# Patient Record
Sex: Female | Born: 1935 | Race: White | Hispanic: No | Smoking: Former smoker
Health system: Southern US, Community
[De-identification: ages and names within clinical notes are randomized; demographics above are authoritative.]

## PROBLEM LIST (undated history)

## (undated) DIAGNOSIS — J310 Chronic rhinitis: Secondary | ICD-10-CM

## (undated) DIAGNOSIS — I10 Essential (primary) hypertension: Secondary | ICD-10-CM

## (undated) DIAGNOSIS — E785 Hyperlipidemia, unspecified: Secondary | ICD-10-CM

## (undated) DIAGNOSIS — M47812 Spondylosis without myelopathy or radiculopathy, cervical region: Secondary | ICD-10-CM

## (undated) DIAGNOSIS — J8410 Pulmonary fibrosis, unspecified: Secondary | ICD-10-CM

## (undated) DIAGNOSIS — M8580 Other specified disorders of bone density and structure, unspecified site: Secondary | ICD-10-CM

## (undated) DIAGNOSIS — I471 Supraventricular tachycardia, unspecified: Secondary | ICD-10-CM

## (undated) DIAGNOSIS — M5030 Other cervical disc degeneration, unspecified cervical region: Secondary | ICD-10-CM

## (undated) DIAGNOSIS — J984 Other disorders of lung: Secondary | ICD-10-CM

## (undated) DIAGNOSIS — K219 Gastro-esophageal reflux disease without esophagitis: Secondary | ICD-10-CM

## (undated) DIAGNOSIS — J189 Pneumonia, unspecified organism: Secondary | ICD-10-CM

## (undated) DIAGNOSIS — K8020 Calculus of gallbladder without cholecystitis without obstruction: Secondary | ICD-10-CM

## (undated) HISTORY — DX: Chronic rhinitis: J31.0

## (undated) HISTORY — DX: Supraventricular tachycardia: I47.1

## (undated) HISTORY — DX: Other disorders of lung: J98.4

## (undated) HISTORY — DX: Spondylosis without myelopathy or radiculopathy, cervical region: M47.812

## (undated) HISTORY — DX: Essential (primary) hypertension: I10

## (undated) HISTORY — PX: TONSILLECTOMY: SHX5217

## (undated) HISTORY — DX: Pulmonary fibrosis, unspecified: J84.10

## (undated) HISTORY — PX: KNEE SURGERY: SHX244

## (undated) HISTORY — PX: ANKLE SURGERY: SHX546

## (undated) HISTORY — PX: TONSILLECTOMY AND ADENOIDECTOMY: SUR1326

## (undated) HISTORY — DX: Other specified disorders of bone density and structure, unspecified site: M85.80

## (undated) HISTORY — DX: Hyperlipidemia, unspecified: E78.5

## (undated) HISTORY — PX: DILATION AND CURETTAGE OF UTERUS: SHX78

## (undated) HISTORY — PX: BREAST SURGERY: SHX581

## (undated) HISTORY — DX: Other cervical disc degeneration, unspecified cervical region: M50.30

## (undated) HISTORY — DX: Supraventricular tachycardia, unspecified: I47.10

## (undated) HISTORY — DX: Gastro-esophageal reflux disease without esophagitis: K21.9

## (undated) HISTORY — DX: Pneumonia, unspecified organism: J18.9

---

## 2006-01-04 HISTORY — PX: OTHER SURGICAL HISTORY: SHX169

## 2006-06-27 ENCOUNTER — Ambulatory Visit: Payer: Self-pay | Admitting: Cardiology

## 2006-07-05 ENCOUNTER — Ambulatory Visit: Payer: Self-pay | Admitting: Cardiology

## 2006-08-03 ENCOUNTER — Ambulatory Visit: Payer: Self-pay | Admitting: Cardiology

## 2006-11-23 ENCOUNTER — Ambulatory Visit: Payer: Self-pay | Admitting: Cardiology

## 2006-11-25 ENCOUNTER — Ambulatory Visit: Payer: Self-pay | Admitting: Internal Medicine

## 2006-12-05 ENCOUNTER — Ambulatory Visit: Payer: Self-pay | Admitting: Internal Medicine

## 2006-12-05 ENCOUNTER — Observation Stay (HOSPITAL_COMMUNITY): Admission: AD | Admit: 2006-12-05 | Discharge: 2006-12-06 | Payer: Self-pay | Admitting: Internal Medicine

## 2006-12-05 ENCOUNTER — Ambulatory Visit: Payer: Self-pay | Admitting: Cardiology

## 2007-01-13 ENCOUNTER — Ambulatory Visit: Payer: Self-pay | Admitting: Internal Medicine

## 2010-05-19 NOTE — Letter (Signed)
January 13, 2007    Doreen Beam, MD  [address]  [place], [place] [address]   RE:  BINDI, KLOMP  MRN:  [id number]  /  DOB:  19-Sep-1933   Dear Herminio Commons:   Happy New Year.  Mrs. Mutch is seen following AV nodal modification for  supraventricular tachycardia.  She had some tachy palpitations for a  couple of weeks afterwards which have now abated and she is doing quite  well.   Her medications include multiple nutraceuticals as well as Metoprolol  taken once daily.  Her Cardizem was discontinued at the time of her  procedure.   PHYSICAL EXAMINATION:  VITAL SIGNS:  Today, her blood pressure is  133/83.  Her pulse is 75.  LUNGS:  Clear.  HEART:  Sounds were regular.  EXTREMITIES:  Without edema. The groins were not evaluated.  SKIN:  Warm and dry.   An electrocardiogram, dated today, demonstrated sinus rhythm at 68 with  intervals of 0.19, 0.8, 0.39.  The axis was 57 degrees.   IMPRESSION:  1. AV nodal re-entry tachycardia, status post slow pathway      modification.  2. Borderline blood pressure.   Dhruv, Mrs. Sochacki is doing well from an arrhythmia point-of-view.  I have  asked that in anticipation of her visit to you in March, that she wean  herself off of her Toprol.  The recent data suggesting there are greater  benefits for blood pressure management using ACEs and ARBs as  opposed to beta-blockers would suggest that if we do not need to control  her rhythm anymore that an alternative antihypertensive would be  appropriate.   We will be glad to see her again as needed at your request.  Thank you  again for allowing [place] to participate in her care.    Sincerely,      Duke Salvia, MD, [facility]  Electronically Signed    SCK/MedQ  DD: 01/13/2007  DT: 01/13/2007  Job #: [id number]

## 2010-05-19 NOTE — Assessment & Plan Note (Signed)
[facility]                                 ON-CALL NOTE   NAME:CHEWRussia, [name]                           MRN:          [id number]  DATE:11/23/2006                            DOB:          02-28-1933    PRIMARY CARDIOLOGIST:  Dr. Andee Lineman.   I received page to the answering service from [phone] from Ms.  Rafanan.  She states she has a history of supraventricular tachycardia, and  has a prescription for Cardizem 30 mg p.o. p.r.n. for sudden onset of  palpitations.  She states she is also on atenolol which she takes  regularly.  Tonight she had an episode of rapid heartbeat without  complaints of chest discomfort, lightheadedness, or dizziness.  She was  just very aware of the palpitations.  She had taken Cardizem 30 mg p.o.  dose, and was still experiencing the palpitations.  Once again, she  denied any other associated symptoms.  I instructed to her to repeat the  Cardizem as it had been an hour since she had taken the first dose, and  to wait 20 to 30 minutes if she did not feel any relief of her  palpitations/ tachycardia she was to go ahead and seek medical  assistance at the closest emergency room.  She states she lives in  [place], [place].  She would take the Cardizem, and if she was not  feeling any better, she would go to the closest emergency room there.      Dorian Pod, ACNP  Electronically Signed      Jonelle Sidle, MD  Electronically Signed   MB/MedQ  DD: 11/23/2006  DT: 11/24/2006  Job #: [id number]

## 2010-05-19 NOTE — Assessment & Plan Note (Signed)
[facility]                          [facility] OFFICE NOTE   NAME:[name], [name]                         MRN:          [id number]  DATE:08/03/2006                            DOB:          1933-12-20    HISTORY OF PRESENT ILLNESS:  The patient is a 77 year old female  recently admitted to [facility] with supraventricular tachycardia  with short PR interval suggestive of AV nodal reentry tachycardia.  The  patient has a history of nonobstructive coronary artery disease by  catheterization in [facility] in 2003.  An echocardiographic study done  during her recent hospitalization demonstrates normal LV size and  function with an ejection fraction of 60%.  The patient was discharged  on atenolol 50 mg b.i.d. and p.r.n. diltiazem.  She has had no  recurrence of palpitations since hospital discharge; in fact, she is  doing quite well.   MEDICATIONS:  1. Atenolol 50 mg p.o. b.i.d.  2. Tenex 2 mg p.o. daily.  3. Aspirin 81 mg daily.  4. Lipitor 40 mg p.o. daily.  5. Glucosamine, calcium and Coenzyme Q-10 and fish oil.   PHYSICAL EXAMINATION:  VITAL SIGNS:  Blood pressure 140/83, heart rate  61 beats per minute, weight 140 pounds.  NECK:  Normal carotid upstroke.  No carotid bruits.  LUNGS:  Clear breath sounds bilaterally.  HEART:  Regular rate and rhythm with normal S1, S2, no murmurs, rubs or  gallops.  ABDOMEN:  Soft.  EXTREMITIES:  No cyanosis, clubbing or edema.  NEUROLOGIC:  The patient alert and oriented and grossly nonfocal.   PROBLEM LIST:  1. 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  2. No evidence of structural heart disease.  3. Hypertension.  4. Nonobstructive coronary artery disease by catheterization 2003.   PLAN:  1. The patient is doing well from a cardiovascular perspective.  She      has had no recurrent palpitations.  2. We had a brief discussion again about radiofrequency ablation but      given the fact that  these      episodes occur very infrequently, I do not think the patient needs      to proceed with this procedure at the present time.  We did give      her literature and more information.     Learta Codding, MD,FACC  Electronically Signed    GED/MedQ  DD: 08/03/2006  DT: 08/04/2006  Job #: [id number]   cc:   Doreen Beam

## 2010-05-19 NOTE — Op Note (Signed)
NAMEHULA, TASSO NO.:  [id number]   MEDICAL RECORD NO.:  [id number]          PATIENT TYPE:  INP   LOCATION:  3707                         FACILITY:  [facility]   PHYSICIAN:  Duke Salvia, MD, FACCDATE OF BIRTH:  03/07/1933   DATE OF PROCEDURE:  12/05/2006  DATE OF DISCHARGE:                               OPERATIVE REPORT   PREOPERATIVE DIAGNOSIS:  Supraventricular tachycardia.   POSTOPERATIVE DIAGNOSIS:  Atrioventricular nodal reentry tachycardia.   PROCEDURE:  Invasive electrophysiological study of the left heart and  electrophysiological study radio frequency catheter ablation.   Following the obtaining of informed consent, the patient was brought to  the electrophysiology laboratory and placed on the fluoroscopic table in  the supine position.  After routine prep and drape, cardiac  catheterization was performed with local anesthesia and conscious  sedation.  Noninvasive blood pressure monitoring, transcutaneous oxygen  saturation monitoring and end tidal CO2 monitoring were performed  continuously throughout the procedure.  Following the procedure, the  catheter was removed, hemostasis was obtained and this patient was  transferred to the floor in stable condition.   Quadripolar catheter was inserted via the left femoral vein in the AV  junction.  Next, a 5-French quadripolar catheter was inserted via left  femoral vein to the right ventricular apex.  A 6-French octapolar  catheter inserted via the right femoral vein to the coronary sinus.  A 7  French 4 mm deflectable tip ablation catheter was inserted via the right  femoral vein using an SL2 sheath and mapping sites in the posterior  septal space.   The leads I, AVS and VI were monitored continuously throughout the  procedure.  Following insertion of the catheters, the stimulation  protocol included incremental atrial pacing, incremental ventricular  pacing, single and double atrial extra stimuli  with pace cyclings at 500  and 400 milliseconds.   RESULTS:  1. First Electrocardiogram At Basic Intervals:  Initial rhythm sinus.      RR interval, 675 milliseconds.  QRS duration is 79 milliseconds.      QT interval 346 milliseconds.  P wave duration 98 milliseconds.      Bundle branch block absent.  Preexcitation absent.  AH interval 117      milliseconds.  HE interval 41 milliseconds.  2. Final:  Rhythm:  Sinus.  RR interval 553 milliseconds.  PR interval      168 milliseconds.  QRS duration 105 milliseconds.  QT interval 379      milliseconds.  P wave duration 96 milliseconds.  Bundle branch      block absent.  Preexcitation absent.  AH interval 103 milliseconds.      HE interval 52 milliseconds.   AV Wenckebach preablation was shorter than 320 milliseconds.  Postablation was 360 milliseconds.  VA Wenckebach was at about 400  milliseconds.  The AV nodal effect was refractory.  At a pace cycling at  400 milliseconds was 230 milliseconds, then 500 milliseconds and 300  milliseconds.   AV conduction preablation was discontinued for the echo beats and post  ablation, there was an  isolated echo beat prior to ERP.   Accessory pathway function, no evidence of any accessory pathway was  identified.   Ventricular response to programmed stimulation was normal for V  stimulation as described previously.   Next, sensory arrhythmia is induced.  AV nodal reentry (low-fast  tachycardia was reproducibly induced), this was done both at burst  pacing from the coronary sinuses as well as single atrial extrastimuli  at 400:270-250.  The tachycardia cycling ranged from 330 milliseconds to  approximately 280 milliseconds and often had a left bundle branch block  configuration.  It was terminated with coronary sinus pacing at 240  milliseconds.   In a typical episode of tachycardia AH interval was 215 milliseconds.  The AE interval was 73 milliseconds and the VA time was 22 milliseconds.    Fluoroscopy time a total of 3 minutes 26 seconds and fluoroscopy time  was utilized at 7-1/2 frames per second.   Radiofrequency energy:  A total of 36 seconds of RF energy was applied  in three applications with the result of junctional rhythm and the  noninducibility of the patient's clinical arrhythmia.   IMPRESSION:  1. Normal sinus function.  2. Normal atrial function.  3. Dual antegrade atrioventricular nodal physiology with a dual  slow-      fast atrioventricular nodal reentry tachycardia, the substrate of      which was successfully ablated and rendering the patient      noninducible.  4. Normal His Purkinje system function.  5. No accessory pathway.  6. Normal ventricular response to programmed stimulation.   SUMMARY:  In conclusion, the results of electrophysiological testing  demonstrated AV nodal reentry tachycardia as the patient's underlying  arrhythmia mechanism.  The patient's arrhythmic substrate was  successfully modified using radiofrequency energy.   The patient tolerated the procedure without apparent complications.      Duke Salvia, MD, [facility]  Electronically Signed     SCK/MEDQ  D:  12/06/2006  T:  12/06/2006  Job:  [id number]

## 2010-07-05 HISTORY — PX: NASAL SINUS SURGERY: SHX719

## 2010-10-12 LAB — CBC
HCT: 39.8
Hemoglobin: 13.8
MCV: 92.8
RBC: 4.29
WBC: 8.7

## 2011-03-01 ENCOUNTER — Encounter: Payer: Self-pay | Admitting: Pulmonary Disease

## 2011-03-02 ENCOUNTER — Other Ambulatory Visit (INDEPENDENT_AMBULATORY_CARE_PROVIDER_SITE_OTHER): Payer: Medicare Other

## 2011-03-02 ENCOUNTER — Encounter: Payer: Self-pay | Admitting: Pulmonary Disease

## 2011-03-02 ENCOUNTER — Ambulatory Visit (INDEPENDENT_AMBULATORY_CARE_PROVIDER_SITE_OTHER): Payer: Medicare Other | Admitting: Pulmonary Disease

## 2011-03-02 VITALS — BP 130/78 | HR 79 | Temp 97.6°F | Ht 62.0 in | Wt 136.2 lb

## 2011-03-02 DIAGNOSIS — J849 Interstitial pulmonary disease, unspecified: Secondary | ICD-10-CM | POA: Insufficient documentation

## 2011-03-02 DIAGNOSIS — J8410 Pulmonary fibrosis, unspecified: Secondary | ICD-10-CM

## 2011-03-02 NOTE — Progress Notes (Signed)
  Subjective:    Patient ID: [name], female    DOB: September 27, 1933, 78 y.o.   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 She developed a sinus and ear infection in December and subsequent yellow sputum and was treated with antibiotics. Cough improved some and then returned, chest x-ray 12/24/2010 showed patchy parenchymal interstitial infiltrates with seem to have progressed compared to 07/01/2008. Just extra was repeated on 01/27/2011 and the chronic interstitial changes weren't again noted. CT chest on 02/04/2011 showed patchy areas of interstitial capacities with subpleural predominance and bibasal peripheral bronchiectasis. There were no groundglass opacities noted. Bilateral lymph nodes were calcified and splenic granulomas were noted . She was treated with Hydromet cough syrup and prednisone dosepak. She reports mild dyspnea on exertion, CT sinuses on 7/12 showed mild mucosal thickening. Her symptoms of reflux are well controlled on omeprazole. She denies joint pains or swelling, skin rash or photosensitivity. ANA pos, ACE level 18, ESR 31, RA < 10, hypersens panel pending She has a dog at home , and denies exposure to birds She did not desaturate on walking 3 laps around the office today.   Review of Systems  Constitutional: Positive for appetite change and unexpected weight change. Negative for fever.  HENT: Negative for ear pain, nosebleeds, congestion, sore throat, rhinorrhea, sneezing, trouble swallowing, dental problem, postnasal drip and sinus pressure.   Eyes: Negative for redness and itching.  Respiratory: Positive for cough and shortness of breath. Negative for chest tightness and wheezing.   Cardiovascular: Negative for palpitations and leg swelling.  Gastrointestinal: Negative for nausea and vomiting.  Genitourinary: Negative for dysuria.  Musculoskeletal:  Negative for joint swelling.  Skin: Negative for rash.  Neurological: Negative for headaches.  Hematological: Does not bruise/bleed easily.  Psychiatric/Behavioral: Negative for dysphoric mood. The patient is not nervous/anxious.        Objective:   Physical Exam  Gen. Pleasant, well-nourished, in no distress, normal affect ENT - no lesions, no post nasal drip Neck: No JVD, no thyromegaly, no carotid bruits Lungs: no use of accessory muscles, no dullness to percussion, bibasal velcro rales, no rhonchi  Cardiovascular: Rhythm regular, heart sounds  normal, no murmurs or gallops, no peripheral edema Abdomen: soft and non-tender, no hepatosplenomegaly, BS normal. Musculoskeletal: No deformities, no cyanosis or clubbing Neuro:  alert, non focal       Assessment & Plan:

## 2011-03-02 NOTE — Patient Instructions (Signed)
Lab work today PFT's to be done  Ambulatory walk done today Follow up in 2 weeks

## 2011-03-03 LAB — RHEUMATOID FACTOR: Rhuematoid fact SerPl-aCnc: <10 IU/mL (ref <=14)

## 2011-03-03 LAB — ANGIOTENSIN CONVERTING ENZYME: Angiotensin-Converting Enzyme: 18 U/L (ref 8–52)

## 2011-03-03 LAB — ANTI-NUCLEAR AB-TITER (ANA TITER)

## 2011-03-04 NOTE — Assessment & Plan Note (Signed)
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 Will obtain inflammatory markers and hypersensitivity panel . Doubt that this is related to collagenous vascular disease . Will obtain pulmonary function test- this radiologic appearances typical, I doubt that a biopsy would be necessary here. She may be a candidate for pulmonary rehabilitation based on her lung function. Unfortunately we have stopped enrolling patients for the Ascend trial (perfenidone for IPF) I discussed prognosis with her in some detail but will clarify on future visits based on lung function.

## 2011-03-07 LAB — HYPERSENSITIVITY PNUEMONITIS PROFILE

## 2011-03-09 ENCOUNTER — Ambulatory Visit (INDEPENDENT_AMBULATORY_CARE_PROVIDER_SITE_OTHER): Payer: Medicare Other | Admitting: Pulmonary Disease

## 2011-03-09 ENCOUNTER — Telehealth: Payer: Self-pay | Admitting: Pulmonary Disease

## 2011-03-09 DIAGNOSIS — J849 Interstitial pulmonary disease, unspecified: Secondary | ICD-10-CM

## 2011-03-09 DIAGNOSIS — J8410 Pulmonary fibrosis, unspecified: Secondary | ICD-10-CM

## 2011-03-09 LAB — PULMONARY FUNCTION TEST

## 2011-03-09 NOTE — Telephone Encounter (Signed)
Spoke with patient-aware that labs are ok.

## 2011-03-09 NOTE — Progress Notes (Signed)
PFT done today. 

## 2011-03-09 NOTE — Progress Notes (Signed)
Quick Note:  Pt aware of results. ______ 

## 2011-03-15 ENCOUNTER — Telehealth: Payer: Self-pay | Admitting: Pulmonary Disease

## 2011-03-15 NOTE — Telephone Encounter (Signed)
PFts show good lung volumes Diffusion decreased to 60%, can track this no in the future. No new recomm  at this time

## 2011-03-17 NOTE — Telephone Encounter (Signed)
Pt returned call. [name]  

## 2011-03-17 NOTE — Telephone Encounter (Signed)
I spoke with patient about results and she verbalized understanding and had no questions 

## 2011-03-17 NOTE — Telephone Encounter (Signed)
[id number] x1 

## 2011-03-22 ENCOUNTER — Encounter: Payer: Self-pay | Admitting: Pulmonary Disease

## 2011-03-29 ENCOUNTER — Encounter: Payer: Self-pay | Admitting: Pulmonary Disease

## 2011-03-29 ENCOUNTER — Ambulatory Visit (INDEPENDENT_AMBULATORY_CARE_PROVIDER_SITE_OTHER): Payer: Medicare Other | Admitting: Pulmonary Disease

## 2011-03-29 VITALS — BP 112/74 | HR 68 | Temp 97.8°F | Ht 62.0 in | Wt 133.4 lb

## 2011-03-29 DIAGNOSIS — J849 Interstitial pulmonary disease, unspecified: Secondary | ICD-10-CM

## 2011-03-29 DIAGNOSIS — J8410 Pulmonary fibrosis, unspecified: Secondary | ICD-10-CM

## 2011-03-29 MED ORDER — HYDROCODONE-HOMATROPINE 5-1.5 MG/5ML PO SYRP: 5.0000 mL | ORAL_SOLUTION | Freq: Three times a day (TID) | ORAL | Status: DC | PRN

## 2011-03-29 NOTE — Progress Notes (Signed)
  Subjective:    Patient ID: [name], female    DOB: 26-Jul-1933, 78 y.o.   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  She developed a sinus and ear infection in December and subsequent yellow sputum and was treated with antibiotics. Cough improved some and then returned, chest x-ray 12/24/2010 showed patchy parenchymal interstitial infiltrates with seem to have progressed compared to 07/01/2008. cxr was repeated on 01/27/2011 and the chronic interstitial changes weren't again noted. CT chest on 02/04/2011 showed patchy areas of interstitial capacities with subpleural predominance and bibasal peripheral bronchiectasis. There were no groundglass opacities noted. Bilateral lymph nodes were calcified and splenic granulomas were noted . She was treated with Hydromet cough syrup and prednisone dosepak.  She reports mild dyspnea on exertion, CT sinuses on 7/12 showed mild mucosal thickening. Her symptoms of reflux are well controlled on omeprazole.  She denies joint pains or swelling, skin rash or photosensitivity.  ANA pos, ACE level 18, ESR 31, RA < 10, hypersens panel pending  She has a dog at home , and denies exposure to birds  She did not desaturate on walking 3 laps around the office today.    03/29/2011 PFts- no airway obstruction, preserved lung volumes,  DLCO 59% corrects for alvaolar volume Reviewed imaging going back to 2005 ([facility]- for arthroscopy) show some bibasal & RUl scarring Pt states her breathing has been good still gets some winded but not as bad. Pt c/o dry hacking cough.--worse at night -relieved by hycodan  Review of Systems Patient denies significant dyspnea,cough, hemoptysis,  chest pain, palpitations, pedal edema, orthopnea, paroxysmal nocturnal dyspnea, lightheadedness, nausea, vomiting, abdominal or  leg pains      Objective:   Physical  Exam  Gen. Pleasant, well-nourished, in no distress ENT - no lesions, no post nasal drip Neck: No JVD, no thyromegaly, no carotid bruits Lungs: no use of accessory muscles, no dullness to percussion, bibasal rales, no  rhonchi  Cardiovascular: Rhythm regular, heart sounds  normal, no murmurs or gallops, no peripheral edema Musculoskeletal: No deformities, no cyanosis or clubbing         Assessment & Plan:

## 2011-03-29 NOTE — Assessment & Plan Note (Signed)
Mild restriction Will monitor q for worsening - this may be non progressive fibrosis Ct scan can be repeated in a year Await outcomes of ASCEND trial Doubt biopsy necessary here.

## 2011-03-29 NOTE — Patient Instructions (Signed)
Schedule Full PFTs in 6months with FU visit Hycodan refill - 2 tsp at bedtime as needed x 120 cc x 1 refill

## 2011-09-27 ENCOUNTER — Encounter: Payer: Self-pay | Admitting: Pulmonary Disease

## 2011-09-27 ENCOUNTER — Ambulatory Visit (INDEPENDENT_AMBULATORY_CARE_PROVIDER_SITE_OTHER): Payer: Medicare Other | Admitting: Pulmonary Disease

## 2011-09-27 VITALS — BP 130/76 | HR 66 | Temp 98.0°F | Ht 62.0 in | Wt 131.0 lb

## 2011-09-27 DIAGNOSIS — Z23 Encounter for immunization: Secondary | ICD-10-CM

## 2011-09-27 DIAGNOSIS — J8410 Pulmonary fibrosis, unspecified: Secondary | ICD-10-CM

## 2011-09-27 DIAGNOSIS — J849 Interstitial pulmonary disease, unspecified: Secondary | ICD-10-CM

## 2011-09-27 LAB — PULMONARY FUNCTION TEST

## 2011-09-27 MED ORDER — BENZONATATE 200 MG PO CAPS: 200.0000 mg | ORAL_CAPSULE | Freq: Three times a day (TID) | ORAL | Status: DC | PRN

## 2011-09-27 NOTE — Assessment & Plan Note (Signed)
Flu shot Your lung function is stable -DLCO maintained at 56% Take DELSYM cough syrup - 5 ml twice daily as needed for cough Take benzonatate cough perles 200 mg thrice daily as needed for cough Will monitor q 59m with CXR & for oxygen desaturation

## 2011-09-27 NOTE — Progress Notes (Signed)
PFT done today. 

## 2011-09-27 NOTE — Progress Notes (Signed)
  Subjective:    Patient ID: [name], female    DOB: 1933-05-23, 78 y.o.   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  She developed LRI, Cough improved some and then returned, chest x-ray 12/24/2010 showed patchy parenchymal interstitial infiltrates with seem to have progressed compared to 07/01/2008. cxr was repeated on 01/27/2011 and the chronic interstitial changes weren't again noted. CT chest on 02/04/2011 showed patchy areas of interstitial capacities with subpleural predominance and bibasal peripheral bronchiectasis. There were no groundglass opacities noted. Bilateral lymph nodes were calcified and splenic granulomas were noted . She was treated with Hydromet cough syrup and prednisone dosepak.  She reports mild dyspnea on exertion, CT sinuses on 7/12 showed mild mucosal thickening. Her symptoms of reflux are well controlled on omeprazole.  She denies joint pains or swelling, skin rash or photosensitivity.  ANA pos, ACE level 18, ESR 31, RA < 10, hypersens panel pending  She has a dog at home , and denies exposure to birds  She did not desaturate on walking 3 laps around the office today.   03/29/2011  PFts- no airway obstruction, preserved lung volumes,  DLCO 59% corrects for alvaolar volume  Reviewed imaging going back to 2005 ([facility]- for arthroscopy) show some bibasal & RUl scarring    09/27/2011 68m FU Pt states her breathing has been good still gets some winded Up a hill sometimes. Pt c/o dry hacking cough.--worse at night  Reflux ok PFTs stable - DLCO maintained at 56%  Review of Systems neg for any significant sore throat, dysphagia, itching, sneezing, nasal congestion or excess/ purulent secretions, fever, chills, sweats, unintended wt loss, pleuritic or exertional cp, hempoptysis, orthopnea pnd or change in chronic leg swelling. Also denies presyncope,  palpitations, heartburn, abdominal pain, nausea, vomiting, diarrhea or change in bowel or urinary habits, dysuria,hematuria, rash, arthralgias, visual complaints, headache, numbness weakness or ataxia.     Objective:   Physical Exam  Gen. Pleasant, well-nourished, in no distress ENT - no lesions, no post nasal drip Neck: No JVD, no thyromegaly, no carotid bruits Lungs: no use of accessory muscles, no dullness to percussion, rt basal rales, no rhonchi  Cardiovascular: Rhythm regular, heart sounds  normal, no murmurs or gallops, no peripheral edema Musculoskeletal: No deformities, no cyanosis or clubbing        Assessment & Plan:

## 2011-09-27 NOTE — Patient Instructions (Signed)
Flu shot Your lung function is stable Take DELSYM cough syrup - 5 ml twice daily as needed for cough Take benzonatate cough perles 200 mg thrice daily as needed for cough Chest xray next visit

## 2011-10-05 ENCOUNTER — Encounter: Payer: Self-pay | Admitting: Pulmonary Disease

## 2012-03-06 ENCOUNTER — Ambulatory Visit (INDEPENDENT_AMBULATORY_CARE_PROVIDER_SITE_OTHER): Payer: Medicare Other | Admitting: Surgery

## 2012-03-06 ENCOUNTER — Encounter (INDEPENDENT_AMBULATORY_CARE_PROVIDER_SITE_OTHER): Payer: Self-pay | Admitting: Surgery

## 2012-03-06 VITALS — BP 124/80 | HR 74 | Temp 97.9°F | Resp 14 | Ht 62.0 in | Wt 132.2 lb

## 2012-03-06 DIAGNOSIS — K8010 Calculus of gallbladder with chronic cholecystitis without obstruction: Secondary | ICD-10-CM | POA: Insufficient documentation

## 2012-03-06 NOTE — Progress Notes (Signed)
General Surgery [facility]  Chief Complaint  Patient presents with  . New Evaluation    cholelithiasis - referral from Dr. Doreen Beam    HISTORY: Patient is a 79 year old white female referred by her primary care physician for symptomatic cholelithiasis. Patient has experienced intermittent gastrointestinal symptoms for approximately 6 months. In February 2014 she was admitted to [facility] with abdominal pain and elevated liver function test. Testing ruled out hepatitis. CT scan of the abdomen revealed gallstones and a dilated biliary tract. Liver function tests improved and returned to normal. Patient is now referred for consideration for cholecystectomy with intraoperative cholangiography for cholelithiasis and suspected history of choledocholithiasis.  Patient has had no prior abdominal surgery. She denies jaundice or acholic stools. She denies any previous hepatobiliary or pancreatic disease.  Past Medical History  Diagnosis Date  . Dyslipidemia   . Supraventricular tachycardia   . GERD (gastroesophageal reflux disease)   . Hypertension   . DJD (degenerative joint disease), cervical   . Pneumonia, organism unspecified   . Rhinitis   . Osteopenia   . Lung disease   . Degenerative disc disease, cervical   . Hyperlipidemia      Current Outpatient Prescriptions  Medication Sig Dispense Refill  . Acetaminophen (TYLENOL 8 HOUR PO) Take by mouth as needed.      Marland Kitchen aspirin 81 MG tablet Take 81 mg by mouth daily.      Marland Kitchen atenolol (TENORMIN) 50 MG tablet Take 75 mg by mouth daily.      . Azelastine-Fluticasone (DYMISTA) 137-50 MCG/ACT SUSP Place 1 spray into the nose daily as needed.       . cyclobenzaprine (FLEXERIL) 10 MG tablet Take 10 mg by mouth at bedtime as needed.      . fish oil-omega-3 fatty acids 1000 MG capsule Take 2 g by mouth daily.      Marland Kitchen guanFACINE (TENEX) 2 MG tablet Take 2 mg by mouth at bedtime.      Marland Kitchen omeprazole (PRILOSEC) 20 MG  capsule Take 20 mg by mouth daily.      . sodium chloride (OCEAN) 0.65 % nasal spray Place 1 spray into the nose as needed.      . benzonatate (TESSALON) 200 MG capsule Take 1 capsule (200 mg total) by mouth 3 (three) times daily as needed for cough.  30 capsule  1   No current facility-administered medications for this visit.     Allergies  Allergen Reactions  . Hydrocodone-Homatropine     Upset stomach  . Penicillins   . Amoxicillin-Pot Clavulanate Rash  . Procardia (Nifedipine)     HA     Family History  Problem Relation Age of Onset  . Diabetes Father   . Stroke Father   . Leukemia Mother   . Lupus Sister   . Heart disease Sister   . Arthritis Sister   . Heart attack Sister   . Colon cancer Sister   . Seizures Sister   . Lung disease Brother   . Colon polyps Brother   . Gout Brother   . Hypertension Brother      History   Social History  . Marital Status: Single    Spouse Name: N/A    Number of Children: N/A  . Years of Education: N/A   Occupational History  . Retired OR Engineer, civil (consulting)    Social History Main Topics  . Smoking status: Former Smoker -- 0.80 packs/day for 25 years    Quit  date: 01/05/1980  . Smokeless tobacco: Never Used  . Alcohol Use: No  . Drug Use: No  . Sexually Active: None   Other Topics Concern  . None   Social History Narrative  . None     REVIEW OF SYSTEMS - PERTINENT POSITIVES ONLY: Denies jaundice. Denies acholic stools. Denies fever. Occasional nausea.  EXAM: Filed Vitals:   03/06/12 1101  BP: 124/80  Pulse: 74  Temp: 97.9 F (36.6 C)  Resp: 14    HEENT: normocephalic; pupils equal and reactive; sclerae clear; dentition good; mucous membranes moist NECK:  symmetric on extension; no palpable anterior or posterior cervical lymphadenopathy; no supraclavicular masses; no tenderness CHEST: clear to auscultation bilaterally without rales, rhonchi, or wheezes CARDIAC: regular rate and rhythm without significant murmur;  peripheral pulses are full ABDOMEN: soft without distension; bowel sounds present; no mass; no hepatosplenomegaly; no hernia; no tenderness EXT:  non-tender without edema; no deformity NEURO: no gross focal deficits; no sign of tremor   LABORATORY RESULTS: See [facility] (CHL-Epic) for most recent results   RADIOLOGY RESULTS: See [facility] (CHL-Epic) for most recent results   IMPRESSION: #1 cholelithiasis, symptomatic #2 history of elevated liver function test, suspect choledocholithiasis, resolved #3 hypertension  PLAN: I discussed the indications for cholecystectomy with the patient. I provided her with written literature to review. I suspect she was hospitalized in early February with a common bile duct stone which passed and allowed her liver function tests to return to normal. I have recommended laparoscopic cholecystectomy with intraoperative cholangiography. We have discussed the procedure. We have discussed potential complications including the need for conversion to open surgery. We have discussed the hospital stay and the return to normal activity. She understands and wishes to proceed in the near future.  The risks and benefits of the procedure have been discussed at length with the patient.  The patient understands the proposed procedure, potential alternative treatments, and the course of recovery to be expected.  All of the patient's questions have been answered at this time.  The patient wishes to proceed with surgery.  Velora Heckler, MD, FACS General & Endocrine Surgery [facility]   Visit Diagnoses: 1. Cholelithiasis with cholecystitis     Primary Care Physician: Ignatius Specking., MD

## 2012-03-06 NOTE — Patient Instructions (Signed)
  [facility]  LAPAROSCOPIC SURGERY - POST-OP INSTRUCTIONS  Always review your discharge instruction sheet given to you by the facility where your surgery was performed.  A prescription for pain medication may be given to you upon discharge.  Take your pain medication as prescribed.  If narcotic pain medicine is not needed, then you may take acetaminophen (Tylenol) or ibuprofen (Advil) as needed.  Take your usually prescribed medications unless otherwise directed.  If you need a refill on your pain medication, please contact your pharmacy.  They will contact our office to request authorization. Prescriptions will not be filled after 5 P.M. or on weekends.  You should follow a light diet the first few days after arrival home, such as soup and crackers or toast.  Be sure to include plenty of fluids daily.  Most patients will experience some swelling and bruising in the area of the incisions.  Ice packs will help.  Swelling and bruising can take several days to resolve.   It is common to experience some constipation if taking pain medication after surgery.  Increasing fluid intake and taking a stool softener (such as Colace) will usually help or prevent this problem from occurring.  A mild laxative (Milk of Magnesia or Miralax) should be taken according to package instructions if there are no bowel movements after 48 hours.  Unless discharge instructions indicate otherwise, you may remove your bandages 24-48 hours after surgery, and you may shower at that time.  You may have steri-strips (small skin tapes) in place directly over the incision.  These strips should be left on the skin for 7-10 days.  If your surgeon used skin glue on the incision, you may shower in 24 hours.  The glue will flake off over the next 2-3 weeks.  Any sutures or staples will be removed at the office during your follow-up visit.  ACTIVITIES:  You may resume regular (light) daily activities beginning the  next day-such as daily self-care, walking, climbing stairs-gradually increasing activities as tolerated.  You may have sexual intercourse when it is comfortable.  Refrain from any heavy lifting or straining until approved by your doctor.  You may drive when you are no longer taking prescription pain medication, you can comfortably wear a seatbelt, and you can safely maneuver your car and apply brakes.  You should see your doctor in the office for a follow-up appointment approximately 2-3 weeks after your surgery.  Make sure that you call for this appointment within a day or two after you arrive home to insure a convenient appointment time.  WHEN TO CALL YOUR DOCTOR: 1. Fever over 101.0 2. Inability to urinate 3. Continued bleeding from incision 4. Increased pain, redness, or drainage from the incision 5. Increasing abdominal pain  The clinic staff is available to answer your questions during regular business hours.  Please don't hesitate to call and ask to speak to one of the nurses for clinical concerns.  If you have a medical emergency, go to the nearest emergency room or call 911.  A surgeon from [facility] is always on call for the hospital.  Todd M. Gerkin, MD, FACS [facility] Office: [phone] Toll Free:  [phone] FAX [phone]  Web site: [email or web address] 

## 2012-03-07 ENCOUNTER — Encounter (HOSPITAL_COMMUNITY): Payer: Self-pay | Admitting: Pharmacy Technician

## 2012-03-13 ENCOUNTER — Ambulatory Visit (HOSPITAL_COMMUNITY)
Admission: RE | Admit: 2012-03-13 | Discharge: 2012-03-13 | Disposition: A | Discharge status: Home / Self Care | Payer: Medicare Other | Source: Ambulatory Visit | Attending: Surgery | Admitting: Surgery

## 2012-03-13 ENCOUNTER — Encounter (HOSPITAL_COMMUNITY)
Admission: RE | Admit: 2012-03-13 | Discharge: 2012-03-13 | Disposition: A | Discharge status: Home / Self Care | Payer: Medicare Other | Source: Ambulatory Visit | Attending: Surgery | Admitting: Surgery

## 2012-03-13 ENCOUNTER — Encounter (HOSPITAL_COMMUNITY): Payer: Self-pay

## 2012-03-13 DIAGNOSIS — Z01810 Encounter for preprocedural cardiovascular examination: Secondary | ICD-10-CM | POA: Insufficient documentation

## 2012-03-13 DIAGNOSIS — J8410 Pulmonary fibrosis, unspecified: Secondary | ICD-10-CM | POA: Insufficient documentation

## 2012-03-13 DIAGNOSIS — Z01812 Encounter for preprocedural laboratory examination: Secondary | ICD-10-CM | POA: Insufficient documentation

## 2012-03-13 DIAGNOSIS — Z01818 Encounter for other preprocedural examination: Secondary | ICD-10-CM | POA: Insufficient documentation

## 2012-03-13 DIAGNOSIS — I498 Other specified cardiac arrhythmias: Secondary | ICD-10-CM | POA: Insufficient documentation

## 2012-03-13 HISTORY — DX: Calculus of gallbladder without cholecystitis without obstruction: K80.20

## 2012-03-13 LAB — SURGICAL PCR SCREEN: Staphylococcus aureus: NEGATIVE

## 2012-03-13 LAB — BASIC METABOLIC PANEL
BUN: 16 mg/dL (ref 6–23)
Calcium: 10.5 mg/dL (ref 8.4–10.5)
Creatinine, Ser: 0.96 mg/dL (ref 0.50–1.10)
GFR calc Af Amer: 65 mL/min — ABNORMAL LOW (ref >90)
GFR calc non Af Amer: 56 mL/min — ABNORMAL LOW (ref >90)

## 2012-03-13 LAB — CBC
HCT: 40.6 % (ref 36.0–46.0)
MCH: 31.8 pg (ref 26.0–34.0)
MCHC: 33.7 g/dL (ref 30.0–36.0)
MCV: 94.2 fL (ref 78.0–100.0)
RDW: 12 % (ref 11.5–15.5)

## 2012-03-13 NOTE — Patient Instructions (Addendum)
[name]  03/13/2012                           YOUR PROCEDURE IS SCHEDULED ON: 03/21/12               PLEASE REPORT TO SHORT STAY CENTER AT :  5:15 AM               CALL THIS NUMBER IF ANY PROBLEMS THE DAY OF SURGERY :               [phone]                      REMEMBER:   Do not eat food or drink liquids AFTER MIDNIGHT   Take these medicines the morning of surgery with A SIP OF WATER: ATENOLOL /OMEPRAZOLE   Do not wear jewelry, make-up   Do not wear lotions, powders, or perfumes.   Do not shave legs or underarms 12 hrs. before surgery (men may shave face)  Do not bring valuables to the hospital.  Contacts, dentures or bridgework may not be worn into surgery.  Leave suitcase in the car. After surgery it may be brought to your room.  For patients admitted to the hospital more than one night, checkout time is 11:00                          The day of discharge.   Patients discharged the day of surgery will not be allowed to drive home                             If going home same day of surgery, must have someone stay with you first                           24 hrs at home and arrange for some one to drive you home from hospital.    Special Instructions:   Please read over the following fact sheets that you were given:               1. MRSA  INFORMATION                      2. [place] PREPARING FOR SURGERY SHEET               3. DISCONTINUE ASPIRIN AND HERBAL MEDICATIONS 5-7 DAYS PREOP                                                X_____________________________________________________________________        Failure to follow these instructions may result in cancellation of your surgery

## 2012-03-13 NOTE — Progress Notes (Signed)
Quick Note:  These results are acceptable for scheduled surgery.  Ashara Lounsbury M. Mckinlee Dunk, MD, FACS [facility] Office: [phone]   ______ 

## 2012-03-21 ENCOUNTER — Encounter (HOSPITAL_COMMUNITY)
Admission: RE | Disposition: A | Discharge status: Home / Self Care | Payer: Self-pay | Source: Ambulatory Visit | Attending: Surgery

## 2012-03-21 ENCOUNTER — Ambulatory Visit (HOSPITAL_COMMUNITY): Payer: Medicare Other

## 2012-03-21 ENCOUNTER — Ambulatory Visit (HOSPITAL_COMMUNITY): Payer: Medicare Other | Admitting: Anesthesiology

## 2012-03-21 ENCOUNTER — Encounter (HOSPITAL_COMMUNITY): Payer: Self-pay | Admitting: *Deleted

## 2012-03-21 ENCOUNTER — Observation Stay (HOSPITAL_COMMUNITY)
Admission: RE | Admit: 2012-03-21 | Discharge: 2012-03-22 | Disposition: A | Discharge status: Home / Self Care | Payer: Medicare Other | Source: Ambulatory Visit | Attending: Surgery | Admitting: Surgery

## 2012-03-21 ENCOUNTER — Encounter (HOSPITAL_COMMUNITY): Payer: Self-pay | Admitting: Anesthesiology

## 2012-03-21 DIAGNOSIS — I10 Essential (primary) hypertension: Secondary | ICD-10-CM | POA: Insufficient documentation

## 2012-03-21 DIAGNOSIS — K8010 Calculus of gallbladder with chronic cholecystitis without obstruction: Secondary | ICD-10-CM

## 2012-03-21 DIAGNOSIS — K219 Gastro-esophageal reflux disease without esophagitis: Secondary | ICD-10-CM | POA: Insufficient documentation

## 2012-03-21 DIAGNOSIS — I498 Other specified cardiac arrhythmias: Secondary | ICD-10-CM | POA: Insufficient documentation

## 2012-03-21 DIAGNOSIS — Z7982 Long term (current) use of aspirin: Secondary | ICD-10-CM | POA: Insufficient documentation

## 2012-03-21 DIAGNOSIS — E785 Hyperlipidemia, unspecified: Secondary | ICD-10-CM | POA: Insufficient documentation

## 2012-03-21 DIAGNOSIS — Z79899 Other long term (current) drug therapy: Secondary | ICD-10-CM | POA: Insufficient documentation

## 2012-03-21 HISTORY — PX: CHOLECYSTECTOMY: SHX55

## 2012-03-21 SURGERY — LAPAROSCOPIC CHOLECYSTECTOMY WITH INTRAOPERATIVE CHOLANGIOGRAM
Anesthesia: General | Wound class: Clean Contaminated

## 2012-03-21 MED ORDER — EPHEDRINE SULFATE 50 MG/ML IJ SOLN
INTRAMUSCULAR | Status: DC | PRN
  Administered 2012-03-21: 5 mg via INTRAVENOUS
  Administered 2012-03-21 (×2): 10 mg via INTRAVENOUS

## 2012-03-21 MED ORDER — NEOSTIGMINE METHYLSULFATE 1 MG/ML IJ SOLN
INTRAMUSCULAR | Status: DC | PRN
  Administered 2012-03-21: 3 mg via INTRAVENOUS

## 2012-03-21 MED ORDER — IOHEXOL 300 MG/ML  SOLN
INTRAMUSCULAR | Status: DC | PRN
  Administered 2012-03-21: 50 mL via INTRAVENOUS

## 2012-03-21 MED ORDER — CIPROFLOXACIN IN D5W 400 MG/200ML IV SOLN
INTRAVENOUS | Status: AC
  Filled 2012-03-21: qty 200

## 2012-03-21 MED ORDER — GLYCOPYRROLATE 0.2 MG/ML IJ SOLN
INTRAMUSCULAR | Status: DC | PRN
  Administered 2012-03-21: 0.4 mg via INTRAVENOUS

## 2012-03-21 MED ORDER — ATENOLOL 25 MG PO TABS
50.0000 mg | ORAL_TABLET | Freq: Every day | ORAL | Status: DC
  Administered 2012-03-22: 50 mg via ORAL
  Filled 2012-03-21: qty 2

## 2012-03-21 MED ORDER — BUPIVACAINE-EPINEPHRINE (PF) 0.5% -1:200000 IJ SOLN
INTRAMUSCULAR | Status: AC
  Filled 2012-03-21: qty 10

## 2012-03-21 MED ORDER — PANTOPRAZOLE SODIUM 40 MG PO TBEC
40.0000 mg | DELAYED_RELEASE_TABLET | Freq: Every day | ORAL | Status: DC
  Administered 2012-03-22: 40 mg via ORAL
  Filled 2012-03-21: qty 1

## 2012-03-21 MED ORDER — PROPOFOL 10 MG/ML IV BOLUS
INTRAVENOUS | Status: DC | PRN
  Administered 2012-03-21: 140 mg via INTRAVENOUS

## 2012-03-21 MED ORDER — PROMETHAZINE HCL 25 MG/ML IJ SOLN
6.2500 mg | INTRAMUSCULAR | Status: DC | PRN
  Administered 2012-03-21: 6.25 mg via INTRAVENOUS

## 2012-03-21 MED ORDER — LACTATED RINGERS IV SOLN
INTRAVENOUS | Status: DC | PRN
  Administered 2012-03-21: 1000 mL

## 2012-03-21 MED ORDER — CIPROFLOXACIN IN D5W 400 MG/200ML IV SOLN
400.0000 mg | INTRAVENOUS | Status: AC
  Administered 2012-03-21: 400 mg via INTRAVENOUS

## 2012-03-21 MED ORDER — CARBOXYMETHYLCELLULOSE SODIUM 0.5 % OP SOLN: 1.0000 [drp] | Freq: Three times a day (TID) | OPHTHALMIC | Status: DC | PRN

## 2012-03-21 MED ORDER — IOHEXOL 300 MG/ML  SOLN
INTRAMUSCULAR | Status: AC
  Filled 2012-03-21: qty 1

## 2012-03-21 MED ORDER — TRAMADOL HCL 50 MG PO TABS: 50.0000 mg | ORAL_TABLET | Freq: Four times a day (QID) | ORAL | Status: DC | PRN

## 2012-03-21 MED ORDER — POLYVINYL ALCOHOL 1.4 % OP SOLN
1.0000 [drp] | Freq: Three times a day (TID) | OPHTHALMIC | Status: DC | PRN
  Filled 2012-03-21: qty 15

## 2012-03-21 MED ORDER — FENTANYL CITRATE 0.05 MG/ML IJ SOLN
INTRAMUSCULAR | Status: DC | PRN
  Administered 2012-03-21: 50 ug via INTRAVENOUS

## 2012-03-21 MED ORDER — DEXAMETHASONE SODIUM PHOSPHATE 10 MG/ML IJ SOLN
INTRAMUSCULAR | Status: DC | PRN
  Administered 2012-03-21: 10 mg via INTRAVENOUS

## 2012-03-21 MED ORDER — KCL IN DEXTROSE-NACL 20-5-0.45 MEQ/L-%-% IV SOLN
INTRAVENOUS | Status: AC
  Filled 2012-03-21: qty 1000

## 2012-03-21 MED ORDER — ATENOLOL 25 MG PO TABS
25.0000 mg | ORAL_TABLET | Freq: Every day | ORAL | Status: DC
  Administered 2012-03-21: 25 mg via ORAL
  Filled 2012-03-21 (×2): qty 1

## 2012-03-21 MED ORDER — LACTATED RINGERS IV SOLN: INTRAVENOUS | Status: DC

## 2012-03-21 MED ORDER — PROMETHAZINE HCL 25 MG/ML IJ SOLN: 12.5000 mg | Freq: Four times a day (QID) | INTRAMUSCULAR | Status: DC | PRN

## 2012-03-21 MED ORDER — KCL IN DEXTROSE-NACL 20-5-0.45 MEQ/L-%-% IV SOLN
INTRAVENOUS | Status: DC
  Administered 2012-03-22: 06:00:00 via INTRAVENOUS
  Filled 2012-03-21 (×2): qty 1000

## 2012-03-21 MED ORDER — BUPIVACAINE-EPINEPHRINE 0.5% -1:200000 IJ SOLN
INTRAMUSCULAR | Status: DC | PRN
  Administered 2012-03-21: 18 mL

## 2012-03-21 MED ORDER — HYDROMORPHONE HCL PF 1 MG/ML IJ SOLN
1.0000 mg | INTRAMUSCULAR | Status: DC | PRN
  Administered 2012-03-21: 1 mg via INTRAVENOUS
  Filled 2012-03-21: qty 1

## 2012-03-21 MED ORDER — LIDOCAINE HCL (CARDIAC) 20 MG/ML IV SOLN
INTRAVENOUS | Status: DC | PRN
  Administered 2012-03-21: 100 mg via INTRAVENOUS

## 2012-03-21 MED ORDER — FENTANYL CITRATE 0.05 MG/ML IJ SOLN
25.0000 ug | INTRAMUSCULAR | Status: DC | PRN
  Administered 2012-03-21: 50 ug via INTRAVENOUS
  Administered 2012-03-21 (×2): 25 ug via INTRAVENOUS

## 2012-03-21 MED ORDER — ACETAMINOPHEN 325 MG PO TABS: 650.0000 mg | ORAL_TABLET | ORAL | Status: DC | PRN

## 2012-03-21 MED ORDER — ROCURONIUM BROMIDE 100 MG/10ML IV SOLN
INTRAVENOUS | Status: DC | PRN
  Administered 2012-03-21: 30 mg via INTRAVENOUS

## 2012-03-21 MED ORDER — ONDANSETRON HCL 4 MG/2ML IJ SOLN
INTRAMUSCULAR | Status: DC | PRN
  Administered 2012-03-21: 4 mg via INTRAVENOUS

## 2012-03-21 MED ORDER — GUANFACINE HCL 2 MG PO TABS
2.0000 mg | ORAL_TABLET | Freq: Every day | ORAL | Status: DC
  Administered 2012-03-21: 2 mg via ORAL
  Filled 2012-03-21 (×2): qty 1

## 2012-03-21 MED ORDER — MIDAZOLAM HCL 5 MG/5ML IJ SOLN
INTRAMUSCULAR | Status: DC | PRN
  Administered 2012-03-21: 2 mg via INTRAVENOUS

## 2012-03-21 MED ORDER — SUCCINYLCHOLINE CHLORIDE 20 MG/ML IJ SOLN
INTRAMUSCULAR | Status: DC | PRN
  Administered 2012-03-21: 100 mg via INTRAVENOUS

## 2012-03-21 MED ORDER — LACTATED RINGERS IV SOLN
INTRAVENOUS | Status: DC | PRN
  Administered 2012-03-21 (×2): via INTRAVENOUS

## 2012-03-21 MED ORDER — 0.9 % SODIUM CHLORIDE (POUR BTL) OPTIME
TOPICAL | Status: DC | PRN
  Administered 2012-03-21: 1000 mL

## 2012-03-21 MED ORDER — PROMETHAZINE HCL 25 MG/ML IJ SOLN
INTRAMUSCULAR | Status: AC
  Filled 2012-03-21: qty 1

## 2012-03-21 MED ORDER — FENTANYL CITRATE 0.05 MG/ML IJ SOLN
INTRAMUSCULAR | Status: AC
  Filled 2012-03-21: qty 2

## 2012-03-21 SURGICAL SUPPLY — 40 items
APPLIER CLIP ROT 10 11.4 M/L (STAPLE) ×2
BENZOIN TINCTURE PRP APPL 2/3 (GAUZE/BANDAGES/DRESSINGS) ×2 IMPLANT
CABLE HIGH FREQUENCY MONO STRZ (ELECTRODE) ×2 IMPLANT
CANISTER SUCTION 2500CC (MISCELLANEOUS) ×2 IMPLANT
CHLORAPREP W/TINT 26ML (MISCELLANEOUS) ×2 IMPLANT
CLIP APPLIE ROT 10 11.4 M/L (STAPLE) ×1 IMPLANT
CLOTH BEACON ORANGE TIMEOUT ST (SAFETY) ×2 IMPLANT
COVER MAYO STAND STRL (DRAPES) ×2 IMPLANT
DECANTER SPIKE VIAL GLASS SM (MISCELLANEOUS) ×2 IMPLANT
DRAPE C-ARM 42X72 X-RAY (DRAPES) ×2 IMPLANT
DRAPE LAPAROSCOPIC ABDOMINAL (DRAPES) ×2 IMPLANT
DRAPE UTILITY 15X26 (DRAPE) ×2 IMPLANT
ELECT REM PT RETURN 9FT ADLT (ELECTROSURGICAL) ×2
ELECTRODE REM PT RTRN 9FT ADLT (ELECTROSURGICAL) ×1 IMPLANT
GAUZE SPONGE 2X2 8PLY STRL LF (GAUZE/BANDAGES/DRESSINGS) ×1 IMPLANT
GLOVE BIOGEL PI IND STRL 7.0 (GLOVE) ×1 IMPLANT
GLOVE BIOGEL PI INDICATOR 7.0 (GLOVE) ×1
GLOVE SURG ORTHO 8.0 STRL STRW (GLOVE) ×2 IMPLANT
GOWN STRL NON-REIN LRG LVL3 (GOWN DISPOSABLE) ×2 IMPLANT
GOWN STRL REIN XL XLG (GOWN DISPOSABLE) ×4 IMPLANT
HEMOSTAT SURGICEL 4X8 (HEMOSTASIS) IMPLANT
KIT BASIN OR (CUSTOM PROCEDURE TRAY) ×2 IMPLANT
NS IRRIG 1000ML POUR BTL (IV SOLUTION) ×2 IMPLANT
POUCH SPECIMEN RETRIEVAL 10MM (ENDOMECHANICALS) ×2 IMPLANT
SCISSORS LAP 5X35 DISP (ENDOMECHANICALS) ×2 IMPLANT
SET CHOLANGIOGRAPH MIX (MISCELLANEOUS) ×2 IMPLANT
SET IRRIG TUBING LAPAROSCOPIC (IRRIGATION / IRRIGATOR) ×2 IMPLANT
SLEEVE Z-THREAD 5X100MM (TROCAR) IMPLANT
SOLUTION ANTI FOG 6CC (MISCELLANEOUS) ×2 IMPLANT
SPONGE GAUZE 2X2 STER 10/PKG (GAUZE/BANDAGES/DRESSINGS) ×1
STRIP CLOSURE SKIN 1/2X4 (GAUZE/BANDAGES/DRESSINGS) ×2 IMPLANT
SUT MNCRL AB 4-0 PS2 18 (SUTURE) ×2 IMPLANT
TAPE CLOTH SURG 4X10 WHT LF (GAUZE/BANDAGES/DRESSINGS) ×2 IMPLANT
TOWEL OR 17X26 10 PK STRL BLUE (TOWEL DISPOSABLE) ×6 IMPLANT
TRAY LAP CHOLE (CUSTOM PROCEDURE TRAY) ×2 IMPLANT
TROCAR SLEEVE XCEL 5X75 (ENDOMECHANICALS) ×2 IMPLANT
TROCAR XCEL BLUNT TIP 100MML (ENDOMECHANICALS) ×2 IMPLANT
TROCAR Z-THREAD FIOS 11X100 BL (TROCAR) ×2 IMPLANT
TROCAR Z-THREAD FIOS 5X100MM (TROCAR) ×4 IMPLANT
TUBING INSUFFLATION 10FT LAP (TUBING) ×2 IMPLANT

## 2012-03-21 NOTE — Op Note (Signed)
Procedure Note  Pre-operative Diagnosis: Calculus of gallbladder with other cholecystitis, without mention of obstruction  Post-operative Diagnosis: Same  Surgeon:  Velora Heckler, MD, FACS  Procedure:  Laparoscopic cholecystectomy with intra-operative cholangiography  Assistant:  none   Anesthesia:  General  Indications: This patient presents with symptomatic gallbladder disease and will undergo laparoscopic cholecystectomy with intraoperative cholangiography.  Procedure Details: The patient was seen in the pre-op holding area. The risks, benefits, complications, treatment options, and expected outcomes have been discussed with the patient. The patient and/or family agreed with the proposed plan and signed the informed consent form.  The patient was taken to Operating Room, identified as [name] and the procedure verified as Laparoscopic Cholecystectomy with Intraoperative Cholangiogram. A "time out" was completed and the above information confirmed.  Prior to the induction of general anesthesia, antibiotic prophylaxis was administered. General endotracheal anesthesia was then administered and tolerated well. After the induction, the abdomen was prepped in the usual strict aseptic fashion. The patient was in the supine position.  An incision was made in the skin near the umbilicus. The midline fascia was incised and the peritoneal cavity entered and the Hasson canula was introduced under direct vision. Hasson canula was secured with a pursestring 0-Vicryl suture. Pneumoperitoneum was then established with carbon dioxide and tolerated well without any adverse changes in the patient's vital signs. Additional trocars were introduced under direct vision along the right costal margin in the midline, mid-clavicular line, and anterior axillary line.  The gallbladder was identified and the fundus grasped and retracted cephalad. Adhesions were taken down bluntly and with the electrocautery as  needed, taking care not to injure any adjacent structures. The infundibulum was grasped and retracted laterally, exposing the peritoneum overlying the triangle of Calot. This was incised and structures exposed in a blunt fashion. The cystic duct was clearly identified and bluntly dissected circumferentially and clipped at the neck of the gallbladder.  An incision was made in the cystic duct and the cholangiogram catheter introduced. The catheter was secured using an ligaclip.  Real-time cholangiography was performed using the C-arm.  There was rapid filling of a normal caliber common bile duct.  There was reflux of contrast into the left and right hepatic ductal systems.  There was free flow distally into the duodenum without filling defect or obstruction.  Catheter was removed from the peritoneal cavity.  The cystic duct was then triply ligated with surgical clips and divided. The cystic artery was identified, dissected circumferentially, ligated with ligaclips, and divided.   The gallbladder was dissected from the liver bed with the electrocautery used for hemostasis. The gallbladder was completely removed and placed into an endocatch bag. The right upper quadrant was irrigated and inspected. Hemostasis was achieved with the electrocautery. Warm saline irrigation was utilized and was repeatedly aspirated until clear.  Pneumoperitoneum was released after viewing removal of the trocars with good hemostasis noted. The umbilical wound was irrigated and the fascia was then closed with the pursestring suture.  The skin was then closed with 4-0 Monocril subcuticular sutures and sterile dressings were applied.  Instrument, sponge, and needle counts were correct at the conclusion of the case.  The patient tolerated the procedure well.  Estimated Blood Loss: Minimal         Drains: none         Specimens: Gallbladder to pathology         Disposition: PACU - hemodynamically stable.         Condition:  stable   Velora Heckler, MD, [facility] Office: [phone]

## 2012-03-21 NOTE — H&P (View-Only) (Signed)
General Surgery [facility]  Chief Complaint  Patient presents with  . New Evaluation    cholelithiasis - referral from Dr. Doreen Beam    HISTORY: Patient is a 79 year old white female referred by her primary care physician for symptomatic cholelithiasis. Patient has experienced intermittent gastrointestinal symptoms for approximately 6 months. In February 2014 she was admitted to [facility] with abdominal pain and elevated liver function test. Testing ruled out hepatitis. CT scan of the abdomen revealed gallstones and a dilated biliary tract. Liver function tests improved and returned to normal. Patient is now referred for consideration for cholecystectomy with intraoperative cholangiography for cholelithiasis and suspected history of choledocholithiasis.  Patient has had no prior abdominal surgery. She denies jaundice or acholic stools. She denies any previous hepatobiliary or pancreatic disease.  Past Medical History  Diagnosis Date  . Dyslipidemia   . Supraventricular tachycardia   . GERD (gastroesophageal reflux disease)   . Hypertension   . DJD (degenerative joint disease), cervical   . Pneumonia, organism unspecified   . Rhinitis   . Osteopenia   . Lung disease   . Degenerative disc disease, cervical   . Hyperlipidemia      Current Outpatient Prescriptions  Medication Sig Dispense Refill  . Acetaminophen (TYLENOL 8 HOUR PO) Take by mouth as needed.      Marland Kitchen aspirin 81 MG tablet Take 81 mg by mouth daily.      Marland Kitchen atenolol (TENORMIN) 50 MG tablet Take 75 mg by mouth daily.      . Azelastine-Fluticasone (DYMISTA) 137-50 MCG/ACT SUSP Place 1 spray into the nose daily as needed.       . cyclobenzaprine (FLEXERIL) 10 MG tablet Take 10 mg by mouth at bedtime as needed.      . fish oil-omega-3 fatty acids 1000 MG capsule Take 2 g by mouth daily.      Marland Kitchen guanFACINE (TENEX) 2 MG tablet Take 2 mg by mouth at bedtime.      Marland Kitchen omeprazole (PRILOSEC) 20 MG  capsule Take 20 mg by mouth daily.      . sodium chloride (OCEAN) 0.65 % nasal spray Place 1 spray into the nose as needed.      . benzonatate (TESSALON) 200 MG capsule Take 1 capsule (200 mg total) by mouth 3 (three) times daily as needed for cough.  30 capsule  1   No current facility-administered medications for this visit.     Allergies  Allergen Reactions  . Hydrocodone-Homatropine     Upset stomach  . Penicillins   . Amoxicillin-Pot Clavulanate Rash  . Procardia (Nifedipine)     HA     Family History  Problem Relation Age of Onset  . Diabetes Father   . Stroke Father   . Leukemia Mother   . Lupus Sister   . Heart disease Sister   . Arthritis Sister   . Heart attack Sister   . Colon cancer Sister   . Seizures Sister   . Lung disease Brother   . Colon polyps Brother   . Gout Brother   . Hypertension Brother      History   Social History  . Marital Status: Single    Spouse Name: N/A    Number of Children: N/A  . Years of Education: N/A   Occupational History  . Retired OR Engineer, civil (consulting)    Social History Main Topics  . Smoking status: Former Smoker -- 0.80 packs/day for 25 years    Quit  date: 01/05/1980  . Smokeless tobacco: Never Used  . Alcohol Use: No  . Drug Use: No  . Sexually Active: None   Other Topics Concern  . None   Social History Narrative  . None     REVIEW OF SYSTEMS - PERTINENT POSITIVES ONLY: Denies jaundice. Denies acholic stools. Denies fever. Occasional nausea.  EXAM: Filed Vitals:   03/06/12 1101  BP: 124/80  Pulse: 74  Temp: 97.9 F (36.6 C)  Resp: 14    HEENT: normocephalic; pupils equal and reactive; sclerae clear; dentition good; mucous membranes moist NECK:  symmetric on extension; no palpable anterior or posterior cervical lymphadenopathy; no supraclavicular masses; no tenderness CHEST: clear to auscultation bilaterally without rales, rhonchi, or wheezes CARDIAC: regular rate and rhythm without significant murmur;  peripheral pulses are full ABDOMEN: soft without distension; bowel sounds present; no mass; no hepatosplenomegaly; no hernia; no tenderness EXT:  non-tender without edema; no deformity NEURO: no gross focal deficits; no sign of tremor   LABORATORY RESULTS: See [facility] (CHL-Epic) for most recent results   RADIOLOGY RESULTS: See [facility] (CHL-Epic) for most recent results   IMPRESSION: #1 cholelithiasis, symptomatic #2 history of elevated liver function test, suspect choledocholithiasis, resolved #3 hypertension  PLAN: I discussed the indications for cholecystectomy with the patient. I provided her with written literature to review. I suspect she was hospitalized in early February with a common bile duct stone which passed and allowed her liver function tests to return to normal. I have recommended laparoscopic cholecystectomy with intraoperative cholangiography. We have discussed the procedure. We have discussed potential complications including the need for conversion to open surgery. We have discussed the hospital stay and the return to normal activity. She understands and wishes to proceed in the near future.  The risks and benefits of the procedure have been discussed at length with the patient.  The patient understands the proposed procedure, potential alternative treatments, and the course of recovery to be expected.  All of the patient's questions have been answered at this time.  The patient wishes to proceed with surgery.  Velora Heckler, MD, FACS General & Endocrine Surgery [facility]   Visit Diagnoses: 1. Cholelithiasis with cholecystitis     Primary Care Physician: Ignatius Specking., MD

## 2012-03-21 NOTE — Transfer of Care (Signed)
Immediate Anesthesia Transfer of Care Note  Patient: [name]  Procedure(s) Performed: Procedure(s): LAPAROSCOPIC CHOLECYSTECTOMY WITH INTRAOPERATIVE CHOLANGIOGRAM (N/A)  Patient Location: PACU  Anesthesia Type:General  Level of Consciousness: sedated  Airway & Oxygen Therapy: Patient Spontanous Breathing and Patient connected to face mask oxygen  Post-op Assessment: Report given to PACU RN and Post -op Vital signs reviewed and stable  Post vital signs: Reviewed and stable  Complications: No apparent anesthesia complications

## 2012-03-21 NOTE — Anesthesia Postprocedure Evaluation (Signed)
Anesthesia Post Note  Patient: [name]  Procedure(s) Performed: Procedure(s) (LRB): LAPAROSCOPIC CHOLECYSTECTOMY WITH INTRAOPERATIVE CHOLANGIOGRAM (N/A)  Anesthesia type: General  Patient location: PACU  Post pain: Pain level controlled  Post assessment: Post-op Vital signs reviewed  Last Vitals:  Filed Vitals:   03/21/12 1027  BP: 121/71  Pulse: 73  Temp: 35.9 C  Resp: 16    Post vital signs: Reviewed  Level of consciousness: sedated  Complications: No apparent anesthesia complications

## 2012-03-21 NOTE — Anesthesia Preprocedure Evaluation (Signed)
Anesthesia Evaluation    Airway Mallampati: II TM Distance: >3 FB Neck ROM: Full    Dental  (+) Teeth Intact, Caps and Loose,    Pulmonary pneumonia -, former smoker,  breath sounds clear to auscultation  Pulmonary exam normal       Cardiovascular hypertension, Pt. on home beta blockers and Pt. on medications + dysrhythmias (S/P abllation for SVT) Supra Ventricular Tachycardia Rhythm:Regular Rate:Normal     Neuro/Psych    GI/Hepatic GERD-  Medicated,  Endo/Other    Renal/GU      Musculoskeletal   Abdominal   Peds  Hematology   Anesthesia Other Findings   Reproductive/Obstetrics                           Anesthesia Physical Anesthesia Plan  ASA: III  Anesthesia Plan: General   Post-op Pain Management:    Induction: Intravenous  Airway Management Planned: Oral ETT  Additional Equipment:   Intra-op Plan:   Post-operative Plan: Extubation in OR  Informed Consent: I have reviewed the patients History and Physical, chart, labs and discussed the procedure including the risks, benefits and alternatives for the proposed anesthesia with the patient or authorized representative who has indicated his/her understanding and acceptance.   Dental advisory given  Plan Discussed with: CRNA  Anesthesia Plan Comments:         Anesthesia Quick Evaluation

## 2012-03-21 NOTE — Interval H&P Note (Signed)
History and Physical Interval Note:  03/21/2012 7:15 AM  [name]  has presented today for surgery, with the diagnosis of symptomatic gallstones.   The various methods of treatment have been discussed with the patient and family. After consideration of risks, benefits and other options for treatment, the patient has consented to    Procedure(s): LAPAROSCOPIC CHOLECYSTECTOMY WITH INTRAOPERATIVE CHOLANGIOGRAM (N/A) as a surgical intervention .    The patient's history has been reviewed, patient examined, no change in status, stable for surgery.  I have reviewed the patient's chart and labs.  Questions were answered to the patient's satisfaction.    Velora Heckler, MD, [facility] Office: [phone]    [name]

## 2012-03-21 NOTE — Care Management Note (Signed)
    Page 1 of 1   03/21/2012     4:20:43 PM   CARE MANAGEMENT NOTE 03/21/2012  Patient:  [name],[name]   Account Number:  [id number]  Date Initiated:  03/21/2012  Documentation initiated by:  Colleen Can  Subjective/Objective Assessment:   DX Lap cholecystectomy with IOC     Action/Plan:   CM spoke with patient.  Plans are for patient to return his home in [place] where spouse will be caregiver. Pt is ambulatory. No HH or DME needs.   Anticipated DC Date:  03/22/2012   Anticipated DC Plan:  HOME/SELF CARE      DC Planning Services  CM consult      Choice offered to / List presented to:             Status of service:  Completed, signed off Medicare Important Message given?   (If response is "NO", the following Medicare IM given date fields will be blank) Date Medicare IM given:   Date Additional Medicare IM given:    Discharge Disposition:    Per UR Regulation:  Reviewed for med. necessity/level of care/duration of stay  If discussed at Long Length of Stay Meetings, dates discussed:    Comments:

## 2012-03-22 ENCOUNTER — Encounter (INDEPENDENT_AMBULATORY_CARE_PROVIDER_SITE_OTHER): Payer: Self-pay

## 2012-03-22 ENCOUNTER — Encounter (HOSPITAL_COMMUNITY): Payer: Self-pay | Admitting: Surgery

## 2012-03-22 MED ORDER — TRAMADOL HCL 50 MG PO TABS: 50.0000 mg | ORAL_TABLET | Freq: Four times a day (QID) | ORAL | Status: DC | PRN

## 2012-03-22 NOTE — Progress Notes (Signed)
Patient discharged to home.  IV removed from right wrist.  Reviewed discharged instructions with patient and family.  No further questions at this time.  Patient does not complain of any nausea or vomitting or abdominal pain.  Dressing to abdomen are dry and intact.  Patient escorted to lobby via wheelchair.  Patient discharged.

## 2012-03-22 NOTE — Discharge Summary (Signed)
Physician Discharge Summary [facility]  Patient ID: [name] MRN: [id number] DOB/AGE: 11-Jul-1933 79 y.o.  Admit date: 03/21/2012 Discharge date: 03/22/2012  Admission Diagnoses:  Symptomatic cholelithiasis  Discharge Diagnoses:  Principal Problem:   Cholelithiasis with cholecystitis   Discharged Condition: good  Hospital Course: patient admitted for observation after lap chole with IOC.  Post op course uneventful.  Pain controlled.  Tolerated regular diet.  Prepared for discharge home POD#1.  Consults: None  Significant Diagnostic Studies: none  Treatments: surgery: lap chole with IOC  Discharge Exam: Blood pressure 116/62, pulse 63, temperature 97.8 F (36.6 C), temperature source Oral, resp. rate 18, height 5\' 2"  (1.575 m), weight 127 lb (57.607 kg), SpO2 96.00%. HEENT - clear Chest - clear Cor - RRR Abd - soft, dressings dry and intact  Disposition: Home with family  Discharge Orders   Future Appointments Provider Department Dept Phone   03/31/2012 9:00 AM Julio Sicks, NP [facility] [phone]   04/07/2012 11:00 AM Velora Heckler, MD [facility], [place] [phone]   Future Orders Complete By Expires     Diet - low sodium heart healthy  As directed     Discharge instructions  As directed     Comments:      [facility]  LAPAROSCOPIC SURGERY - POST-OP INSTRUCTIONS  Always review your discharge instruction sheet given to you by the facility where your surgery was performed.  A prescription for pain medication may be given to you upon discharge.  Take your pain medication as prescribed.  If narcotic pain medicine is not needed, then you may take acetaminophen (Tylenol) or ibuprofen (Advil) as needed.  Take your usually prescribed medications unless otherwise directed.  If you need a refill on your pain medication, please contact your pharmacy.  They will contact our office to request authorization.  Prescriptions will not be filled after 5 P.M. or on weekends.  You should follow a light diet the first few days after arrival home, such as soup and crackers or toast.  Be sure to include plenty of fluids daily.  Most patients will experience some swelling and bruising in the area of the incisions.  Ice packs will help.  Swelling and bruising can take several days to resolve.   It is common to experience some constipation if taking pain medication after surgery.  Increasing fluid intake and taking a stool softener (such as Colace) will usually help or prevent this problem from occurring.  A mild laxative (Milk of Magnesia or Miralax) should be taken according to package instructions if there are no bowel movements after 48 hours.  Unless discharge instructions indicate otherwise, you may remove your bandages 24-48 hours after surgery, and you may shower at that time.  You may have steri-strips (small skin tapes) in place directly over the incision.  These strips should be left on the skin for 7-10 days.  If your surgeon used skin glue on the incision, you may shower in 24 hours.  The glue will flake off over the next 2-3 weeks.  Any sutures or staples will be removed at the office during your follow-up visit.  ACTIVITIES:  You may resume regular (light) daily activities beginning the next day-such as daily self-care, walking, climbing stairs-gradually increasing activities as tolerated.  You may have sexual intercourse when it is comfortable.  Refrain from any heavy lifting or straining until approved by your doctor.  You may drive when you are no longer  taking prescription pain medication, you can comfortably wear a seatbelt, and you can safely maneuver your car and apply brakes.  You should see your doctor in the office for a follow-up appointment approximately 2-3 weeks after your surgery.  Make sure that you call for this appointment within a day or two after you arrive home to insure a convenient  appointment time.  WHEN TO CALL YOUR DOCTOR: Fever over 101.0 Inability to urinate Continued bleeding from incision Increased pain, redness, or drainage from the incision Increasing abdominal pain  The clinic staff is available to answer your questions during regular business hours.  Please don't hesitate to call and ask to speak to one of the nurses for clinical concerns.  If you have a medical emergency, go to the nearest emergency room or call 911.  A surgeon from [facility] is always on call for the hospital.  Velora Heckler, MD, [facility] Office: [phone] Toll Free:  [phone] FAX [phone]  Web site: [email or web address]    Increase activity slowly  As directed     Remove dressing in 24 hours  As directed         Medication List    TAKE these medications       acetaminophen 500 MG tablet  Commonly known as:  TYLENOL  Take 500 mg by mouth every 8 (eight) hours as needed for pain.     aspirin 81 MG tablet  Take 81 mg by mouth daily.     atenolol 50 MG tablet  Commonly known as:  TENORMIN  Take 25-50 mg by mouth 2 (two) times daily. Take 1 tablet in morning and 0.5 in the evening     benzonatate 200 MG capsule  Commonly known as:  TESSALON  Take 200 mg by mouth 3 (three) times daily as needed for cough.     cholecalciferol 1000 UNITS tablet  Commonly known as:  VITAMIN D  Take 1,000 Units by mouth daily.     Co Q10 100 MG Tabs  Take 100 mg by mouth daily.     cyclobenzaprine 10 MG tablet  Commonly known as:  FLEXERIL  Take 10 mg by mouth at bedtime as needed (for back pain).     dextromethorphan 30 MG/5ML liquid  Commonly known as:  DELSYM  Take 60 mg by mouth daily as needed for cough.     docusate sodium 100 MG capsule  Commonly known as:  COLACE  Take 100 mg by mouth 2 (two) times daily.     GLUCOSAMINE PO  Take 3,000 mg by mouth daily.     guanFACINE 2 MG tablet  Commonly known as:   TENEX  Take 2 mg by mouth at bedtime.     multivitamin with minerals Tabs  Take 1 tablet by mouth daily.     omega-3 acid ethyl esters 1 G capsule  Commonly known as:  LOVAZA  Take 1 g by mouth daily.     omeprazole 20 MG capsule  Commonly known as:  PRILOSEC  Take 20 mg by mouth daily.     REFRESH TEARS 0.5 % Soln  Generic drug:  carboxymethylcellulose  Place 1 drop into both eyes 3 (three) times daily as needed.     sodium chloride 0.65 % nasal spray  Commonly known as:  OCEAN  Place 1 spray into the nose as needed.     traMADol 50 MG tablet  Commonly known as:  ULTRAM  Take 50  mg by mouth every 6 (six) hours as needed for pain.     traMADol 50 MG tablet  Commonly known as:  ULTRAM  Take 1 tablet (50 mg total) by mouth every 6 (six) hours as needed for pain.           Follow-up Information   Follow up with Velora Heckler, MD. Schedule an appointment as soon as possible for a visit in 3 weeks.   Contact information:   [address] [place] [address] [phone]       Velora Heckler, MD, [facility] Office: [phone]   Signed: Velora Heckler 03/22/2012, 8:51 AM

## 2012-03-24 ENCOUNTER — Telehealth (INDEPENDENT_AMBULATORY_CARE_PROVIDER_SITE_OTHER): Payer: Self-pay

## 2012-03-24 NOTE — Telephone Encounter (Signed)
LMOM for PO call. Appt alread made.

## 2012-03-27 ENCOUNTER — Ambulatory Visit: Payer: Medicare Other | Admitting: Adult Health

## 2012-03-31 ENCOUNTER — Ambulatory Visit (INDEPENDENT_AMBULATORY_CARE_PROVIDER_SITE_OTHER): Payer: Medicare Other | Admitting: Adult Health

## 2012-03-31 ENCOUNTER — Encounter: Payer: Self-pay | Admitting: Adult Health

## 2012-03-31 VITALS — BP 124/62 | HR 69 | Temp 97.6°F | Ht 62.0 in | Wt 133.0 lb

## 2012-03-31 DIAGNOSIS — J8410 Pulmonary fibrosis, unspecified: Secondary | ICD-10-CM

## 2012-03-31 DIAGNOSIS — J849 Interstitial pulmonary disease, unspecified: Secondary | ICD-10-CM

## 2012-03-31 DIAGNOSIS — J309 Allergic rhinitis, unspecified: Secondary | ICD-10-CM

## 2012-03-31 NOTE — Progress Notes (Signed)
  Subjective:    Patient ID: [name], female    DOB: 11/19/1933, 79 y.o.   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  She developed LRI, Cough improved some and then returned, chest x-ray 12/24/2010 showed patchy parenchymal interstitial infiltrates with seem to have progressed compared to 07/01/2008. cxr was repeated on 01/27/2011 and the chronic interstitial changes weren't again noted. CT chest on 02/04/2011 showed patchy areas of interstitial capacities with subpleural predominance and bibasal peripheral bronchiectasis. There were no groundglass opacities noted. Bilateral lymph nodes were calcified and splenic granulomas were noted . She was treated with Hydromet cough syrup and prednisone dosepak.  She reports mild dyspnea on exertion, CT sinuses on 7/12 showed mild mucosal thickening. Her symptoms of reflux are well controlled on omeprazole.  She denies joint pains or swelling, skin rash or photosensitivity.  ANA pos, ACE level 18, ESR 31, RA < 10, hypersens panel pending  She has a dog at home , and denies exposure to birds  She did not desaturate on walking 3 laps around the office today.   03/29/2011  PFts- no airway obstruction, preserved lung volumes,  DLCO 59% corrects for alvaolar volume  Reviewed imaging going back to 2005 ([facility]- for arthroscopy) show some bibasal & RUl scarring    09/26/12  46m FU Pt states her breathing has been good still gets some winded Up a hill sometimes. Pt c/o dry hacking cough.--worse at night  Reflux ok PFTs stable - DLCO maintained at 56% >>no changes    03/31/2012 6 month follow up for ILD  Patient returns for a six-month followup for ILD She's been doing well. She denies any flare of cough, wheezing, shortness of breath, or decreased activity tolerance. Recent Gallbladder surgery. Tolerated well.  She denies any heartburn,  abdominal pain, nausea, vomiting, or edema. She says she is very active and has had. No increased shortness, of breath Chest x-ray on March 10 showed chronic interstitial lung disease with basilar predominant pulmonary fibrosis, with no acute changes noted. Does complain over the last couple months of postnasal drainage, and drippy nose. Has been using saline nasal spray without much relief. Has tried Claritin without any improvement.  Review of Systems  neg for any significant sore throat, dysphagia, itching, sneezing, nasal congestion or excess/ purulent secretions, fever, chills, sweats, unintended wt loss, pleuritic or exertional cp, hempoptysis, orthopnea pnd or change in chronic leg swelling. Also denies presyncope, palpitations, heartburn, abdominal pain, nausea, vomiting, diarrhea or change in bowel or urinary habits, dysuria,hematuria, rash, arthralgias, visual complaints, headache, numbness weakness or ataxia.     Objective:   Physical Exam   Gen. Pleasant, well-nourished, in no distress ENT - no lesions, no post nasal drip Neck: No JVD, no thyromegaly, no carotid bruits Lungs: no use of accessory muscles, no dullness to percussion,faint bibasal rales, no rhonchi  Cardiovascular: Rhythm regular, heart sounds  normal, no murmurs or gallops, no peripheral edema Musculoskeletal: No deformities, no cyanosis or clubbing        Assessment & Plan:

## 2012-03-31 NOTE — Assessment & Plan Note (Signed)
Compensated on present regimen without any obvious flare. Recent chest x-ray without any progression in her fibrosis. She is continue with her activity level Followup in 6 months and as needed

## 2012-03-31 NOTE — Assessment & Plan Note (Signed)
Mild rhinitis, flare.  Patient encouraged to use. Saline nasal rinses as needed  Use Allegra. 180 mg daily as needed.

## 2012-03-31 NOTE — Patient Instructions (Addendum)
May use Allegra 180mg  daily As needed  Drainage  Saline nasal rinses As needed   Continue to remain active  follow up Dr. Vassie Loll  In 6 months and As needed

## 2012-04-07 ENCOUNTER — Ambulatory Visit (INDEPENDENT_AMBULATORY_CARE_PROVIDER_SITE_OTHER): Payer: Medicare Other | Admitting: Surgery

## 2012-04-07 ENCOUNTER — Encounter (INDEPENDENT_AMBULATORY_CARE_PROVIDER_SITE_OTHER): Payer: Self-pay | Admitting: Surgery

## 2012-04-07 VITALS — BP 132/66 | HR 72 | Temp 97.9°F | Resp 16 | Ht 62.0 in | Wt 130.0 lb

## 2012-04-07 DIAGNOSIS — K8010 Calculus of gallbladder with chronic cholecystitis without obstruction: Secondary | ICD-10-CM

## 2012-04-07 NOTE — Patient Instructions (Signed)
  COCOA BUTTER & VITAMIN E CREAM  ([employer] or other brand)  Apply cocoa butter/vitamin E cream to your incision 2 - 3 times daily.  Massage cream into incision for one minute with each application.  Use sunscreen (50 SPF or higher) for first 6 months after surgery if area is exposed to sun.  You may substitute Mederma or other scar reducing creams as desired.   

## 2012-04-07 NOTE — Progress Notes (Signed)
General Surgery [facility]  Visit Diagnoses: 1. Cholelithiasis with cholecystitis     HISTORY: The patient underwent laparoscopic cholecystectomy on 03/21/2012 for chronic cholecystitis and cholelithiasis. Final pathology results were confirmatory. Postoperative course has been uneventful. Appetite is improving. Bowel habits are normal.  EXAM: Abdomen is soft nontender without distention. His wounds are healing nicely without sign of infection or herniation. Right upper quadrant is soft and nontender without mass.  IMPRESSION: Status post laparoscopic cholecystectomy  PLAN: Patient will apply topical creams to her incisions. She will resume all normal activity. She will return as needed.  Velora Heckler, MD, FACS General & Endocrine Surgery [facility]

## 2012-04-14 ENCOUNTER — Telehealth (INDEPENDENT_AMBULATORY_CARE_PROVIDER_SITE_OTHER): Payer: Self-pay | Admitting: General Surgery

## 2012-04-14 NOTE — Telephone Encounter (Signed)
Patient calls status post lap chole on 03/21/2012 stating she has gone to her PCP, Dr Sherril Croon, and had labs drawn. She states her liver enzymes are still elevated. She is going to have Dr Sherril Croon fax lab results to [place]. She is still having some intermittent right upper quadrant back pain but this is dull and doesn't bother her much. Her biggest complaint is of not having much of an appetite and when she eats she has a lot of indigestion. I told her that all sounds normal after a lap chole but to have those labs faxed for Dr Gerrit Friends to review. She will call with any other problems and I told her we would contact her once the labs have been received and reviewed. [phone].

## 2012-04-17 ENCOUNTER — Telehealth (INDEPENDENT_AMBULATORY_CARE_PROVIDER_SITE_OTHER): Payer: Self-pay

## 2012-04-17 NOTE — Telephone Encounter (Signed)
Message copied by Joanette Gula on Mon Apr 17, 2012  9:06 AM ------      Message from: Velora Heckler      Created: Fri Apr 14, 2012 11:42 AM       Apparently has abnormal lab work.  Please place copy on my desk for me to review.            Thanks,            tmg       ------

## 2012-04-26 ENCOUNTER — Telehealth (INDEPENDENT_AMBULATORY_CARE_PROVIDER_SITE_OTHER): Payer: Self-pay

## 2012-04-26 NOTE — Telephone Encounter (Signed)
Per Dr Gerrit Friends he reviewed labs and requests I called pt to ck on symptoms and advise we can set up abd u/s. Pt states she is improved. No nausea. No abd pain. Pt states she is getting her strength back. Pt states she is having labs repeated 05-12-12 by Dr Sherril Croon. Pt wants to wait to do u/s if symptoms return or if labs in May do not improve. I reviewed this with Dr Gerrit Friends and he approves. Pt advised if symptoms occur or if labs do not improve we will need to do u/s . Pt states she will have Dr Sherril Croon send copy of labs to our office.

## 2012-05-08 ENCOUNTER — Other Ambulatory Visit (INDEPENDENT_AMBULATORY_CARE_PROVIDER_SITE_OTHER): Payer: Self-pay

## 2012-05-08 ENCOUNTER — Telehealth (INDEPENDENT_AMBULATORY_CARE_PROVIDER_SITE_OTHER): Payer: Self-pay

## 2012-05-08 DIAGNOSIS — K8010 Calculus of gallbladder with chronic cholecystitis without obstruction: Secondary | ICD-10-CM

## 2012-05-08 NOTE — Telephone Encounter (Signed)
Patient calling into office today complaining of light colored stools, nausea, loss of appetite. Patient denies vomiting, abdominal pain.  Patient denies having any fevers or chills.  Per telephone note by Dr. Ardine Eng nurse Arline Asp, patient need's abd [place] if symptoms return or liver function results are abnormal.  [place] has been ordered and at patient's request she would like to have [place] at [facility] in [place].  Orders given to [facility] for scheduling.

## 2012-05-09 ENCOUNTER — Other Ambulatory Visit (INDEPENDENT_AMBULATORY_CARE_PROVIDER_SITE_OTHER): Payer: Self-pay | Admitting: General Surgery

## 2012-05-09 ENCOUNTER — Telehealth (INDEPENDENT_AMBULATORY_CARE_PROVIDER_SITE_OTHER): Payer: Self-pay | Admitting: General Surgery

## 2012-05-09 NOTE — Telephone Encounter (Signed)
Spoke with patient she is aware of appt for u/s at [facility] on 05/10/12 at 9 am NPO after midnight

## 2012-05-11 ENCOUNTER — Telehealth (INDEPENDENT_AMBULATORY_CARE_PROVIDER_SITE_OTHER): Payer: Self-pay

## 2012-05-11 NOTE — Telephone Encounter (Signed)
LMOM for pt to call. Pt needs to be advised I have reviewed her u/s result with Dr Magnus Ivan due to Dr Gerrit Friends not being available. Dr Magnus Ivan reviewed her old labs and the u/s report. Per his request I am trying to reach pt and advise her he recommends a hepatobiliary scan due to u/s report. I will continue to try to reach pt to see if pt willing to go for this test.

## 2012-05-11 NOTE — Telephone Encounter (Signed)
Pt returned call. I advised her of u/s results and recommendation by Dr Magnus Ivan. Pt states she is having repeat labs at Dr Bonnita Levan office tomorrow. Pt states she has no nausea,vomiting, abd pain,skin or eye color changes. Pt request to wait until lab results come back before she has any testing. Pt advised to have lab result sent to Dr Gerrit Friends for his review and if any of the above symptoms occur she is to call asap. Pt states she understands.

## 2012-05-12 ENCOUNTER — Encounter (INDEPENDENT_AMBULATORY_CARE_PROVIDER_SITE_OTHER): Payer: Self-pay

## 2012-05-17 ENCOUNTER — Telehealth (INDEPENDENT_AMBULATORY_CARE_PROVIDER_SITE_OTHER): Payer: Self-pay

## 2012-05-17 NOTE — Telephone Encounter (Signed)
Message copied by Joanette Gula on Wed May 17, 2012  9:40 AM ------      Message from: Velora Heckler      Created: Tue May 16, 2012  4:08 PM       Cindy:            I see the "email" trail on this patient.  Ultrasound results do not really show any reason for concern.  Agree that nuclear scan would lay issue to rest.  Try to get labs from Dr. Sherril Croon office for follow-up.            Thanks,            tmg            Velora Heckler, MD, [facility]      Office: [phone]             ------

## 2012-05-17 NOTE — Telephone Encounter (Signed)
Per Dr Ardine Eng request labs from Dr Sherril Croon here and to him for review.

## 2012-06-12 ENCOUNTER — Encounter (INDEPENDENT_AMBULATORY_CARE_PROVIDER_SITE_OTHER): Payer: Self-pay

## 2012-10-05 ENCOUNTER — Encounter: Payer: Self-pay | Admitting: Pulmonary Disease

## 2012-10-05 ENCOUNTER — Ambulatory Visit (INDEPENDENT_AMBULATORY_CARE_PROVIDER_SITE_OTHER): Payer: Medicare Other | Admitting: Pulmonary Disease

## 2012-10-05 VITALS — BP 108/64 | HR 55 | Temp 97.0°F | Ht 62.0 in | Wt 134.4 lb

## 2012-10-05 DIAGNOSIS — J849 Interstitial pulmonary disease, unspecified: Secondary | ICD-10-CM

## 2012-10-05 DIAGNOSIS — Z23 Encounter for immunization: Secondary | ICD-10-CM

## 2012-10-05 DIAGNOSIS — J8410 Pulmonary fibrosis, unspecified: Secondary | ICD-10-CM

## 2012-10-05 NOTE — Progress Notes (Signed)
  Subjective:    Patient ID: [name], female    DOB: 02-28-1933, 79 y.o.   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 There were no groundglass opacities noted. Bilateral lymph nodes were calcified and splenic granulomas were noted .  CT sinuses on 7/12 showed mild mucosal thickening. Her symptoms of reflux are well controlled on omeprazole.  ANA pos, ACE level 18, ESR 31, RA < 10, hypersens panel neg   03/29/2011 PFts- no airway obstruction, preserved lung volumes,  DLCO 59% corrects for alveolar volume  Reviewed imaging going back to 2005 ([facility]- for arthroscopy) show some bibasal & RUl scarring  09/26/12 PFTs stable - DLCO maintained at 56%    10/05/2012  Pt reports her breathing has been okay. She has had a dry cough- worse in the AM or drop of people. At times with exertion she has chest tx and also depends on weather. Denies any wheezing. She does have PND and runny nose She did notdesaturate to 92%on walking 3 laps around the office today. She denies any flare of cough, wheezing, shortness of breath, or decreased activity tolerance.  03/2012 Gallbladder surgery. Tolerated well.  She denies any heartburn, abdominal pain, nausea, vomiting, or edema.  She says she is very active and has had. No increased shortness, of breath  Chest x-ray on March 10 showed chronic interstitial lung disease with basilar predominant pulmonary fibrosis, with no acute changes noted.  Review of Systems neg for any significant sore throat, dysphagia, itching, sneezing, nasal congestion or excess/ purulent secretions, fever, chills, sweats, unintended wt loss, pleuritic or exertional cp, hempoptysis, orthopnea pnd or change in chronic leg swelling.  Also denies presyncope, palpitations, heartburn, abdominal pain, nausea, vomiting, diarrhea or change in bowel or urinary habits, dysuria,hematuria, rash, arthralgias, visual complaints, headache, numbness weakness or ataxia.     Objective:   Physical Exam  Gen. Pleasant, well-nourished, in no distress ENT - no lesions, no post nasal drip Neck: No JVD, no thyromegaly, no carotid bruits Lungs: no use of accessory muscles, no dullness to percussion, clear without rales or rhonchi  Cardiovascular: Rhythm regular, heart sounds  normal, no murmurs or gallops, no peripheral edema Musculoskeletal: No deformities, no cyanosis or clubbing         Assessment & Plan:

## 2012-10-05 NOTE — Patient Instructions (Addendum)
Schedule PFTs FLu shot We will consider you for new medication

## 2012-10-06 NOTE — Assessment & Plan Note (Addendum)
Schedule PFTs FLu shot Consider enrolling in open access perfenidone even though lung function appears stable, may need serology Baseline 

## 2012-10-24 ENCOUNTER — Ambulatory Visit (INDEPENDENT_AMBULATORY_CARE_PROVIDER_SITE_OTHER): Payer: Medicare Other | Admitting: Pulmonary Disease

## 2012-10-24 DIAGNOSIS — J849 Interstitial pulmonary disease, unspecified: Secondary | ICD-10-CM

## 2012-10-24 DIAGNOSIS — J8410 Pulmonary fibrosis, unspecified: Secondary | ICD-10-CM

## 2012-10-24 NOTE — Progress Notes (Signed)
PFT done today. 

## 2012-11-17 ENCOUNTER — Telehealth: Payer: Self-pay | Admitting: Pulmonary Disease

## 2012-11-17 NOTE — Telephone Encounter (Signed)
PFTS 10/14 show mild drop FVC from 2.25 to 1.88 & TLC from 3.6 to 2.95 DLCO stable at 11.1 also % decreased from 59 to 51 Schedule FU oV to discuss therapy for IPF after 

## 2012-11-17 NOTE — Telephone Encounter (Signed)
ATC PT NA WCB 

## 2012-11-20 NOTE — Telephone Encounter (Signed)
ATC PT NA WCB 

## 2012-11-21 NOTE — Telephone Encounter (Signed)
ATC pt line busy wcb 

## 2012-11-22 ENCOUNTER — Encounter: Payer: Self-pay | Admitting: *Deleted

## 2012-11-22 NOTE — Telephone Encounter (Signed)
ATC pt NA. Will send pt a letter and forward to Dr. Vassie Loll so he is aware not able to contact pt.

## 2012-11-27 NOTE — Telephone Encounter (Signed)
I spoke with pt and appt scheduled. Nothing further needed 

## 2012-11-27 NOTE — Telephone Encounter (Signed)
OK 

## 2012-11-27 NOTE — Telephone Encounter (Signed)
Returning call can be reached at [phone].[name]

## 2012-11-27 NOTE — Telephone Encounter (Signed)
I spoke with the pt and advised of results. Your next available is not unil end of Jan, is this OK? Want me to schedule with TP? Please advise. Carron Curie, CMA

## 2012-12-04 ENCOUNTER — Encounter: Payer: Self-pay | Admitting: Pulmonary Disease

## 2012-12-12 ENCOUNTER — Telehealth: Payer: Self-pay | Admitting: Pulmonary Disease

## 2012-12-12 NOTE — Telephone Encounter (Signed)
I spoke with pt. She reports she was placed on celebrex for back pain-takes once daily. She wants to make sure Dr. Vassie Loll thinks this is okay and safe for her to take? She has only been on this x 4 days. Please advise Dr. Vassie Loll thanks

## 2012-12-12 NOTE — Telephone Encounter (Signed)
Pt is aware and nothing further needed 

## 2012-12-12 NOTE — Telephone Encounter (Signed)
Ok from lung standpt

## 2013-01-30 ENCOUNTER — Encounter (INDEPENDENT_AMBULATORY_CARE_PROVIDER_SITE_OTHER): Payer: Medicare Other | Admitting: Pulmonary Disease

## 2013-01-30 ENCOUNTER — Encounter: Payer: Self-pay | Admitting: Pulmonary Disease

## 2013-01-30 ENCOUNTER — Encounter (INDEPENDENT_AMBULATORY_CARE_PROVIDER_SITE_OTHER): Payer: Self-pay

## 2013-01-30 ENCOUNTER — Ambulatory Visit (INDEPENDENT_AMBULATORY_CARE_PROVIDER_SITE_OTHER): Payer: Medicare Other | Admitting: Pulmonary Disease

## 2013-01-30 VITALS — BP 130/62 | HR 63 | Temp 97.5°F | Ht 62.0 in | Wt 139.0 lb

## 2013-01-30 DIAGNOSIS — R0609 Other forms of dyspnea: Secondary | ICD-10-CM

## 2013-01-30 DIAGNOSIS — R0989 Other specified symptoms and signs involving the circulatory and respiratory systems: Secondary | ICD-10-CM

## 2013-01-30 DIAGNOSIS — J849 Interstitial pulmonary disease, unspecified: Secondary | ICD-10-CM

## 2013-01-30 DIAGNOSIS — J8410 Pulmonary fibrosis, unspecified: Secondary | ICD-10-CM

## 2013-01-30 DIAGNOSIS — R0600 Dyspnea, unspecified: Secondary | ICD-10-CM

## 2013-01-30 NOTE — Progress Notes (Signed)
   Subjective:    Patient ID: [name], female    DOB: [birth year], 80 y.o.   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 There were no groundglass opacities noted. Bilateral lymph nodes were calcified and splenic granulomas were noted .  CT sinuses on 7/12 showed mild mucosal thickening. Her symptoms of reflux are well controlled on omeprazole.  ANA pos, ACE level 18, ESR 31, RA < 10, hypersens panel neg  03/29/2011 PFts- no airway obstruction, preserved lung volumes,  DLCO 59% corrects for alveolar volume  Reviewed imaging going back to 2005 ([facility]- for arthroscopy) show some bibasal & RUl scarring  09/26/12 PFTs stable - DLCO maintained at 16.4 - 56%    01/30/2013 57mFU   PFTS 10/2012 show mild drop in FVC from 2.25 to 1.88 & TLC from 3.6 to 2.95  DLCO dropped to 11.1 also % decreased from 59 to 51   6MW -450 m She denies any heartburn, abdominal pain, nausea, vomiting, or edema.  She says she is very active and has had. No increased shortness, of breath  Chest x-ray 05/2012  showed chronic interstitial lung disease with basilar predominant pulmonary fibrosis, with no acute changes noted.  Past Medical History  Diagnosis Date  . Supraventricular tachycardia   . GERD (gastroesophageal reflux disease)   . Hypertension   . DJD (degenerative joint disease), cervical   . Pneumonia, organism unspecified   . Rhinitis   . Osteopenia   . Lung disease     INTERSTITIAL LUNG DISEASE  . Degenerative disc disease, cervical   . Hyperlipidemia   . Degenerative disc disease, cervical   . Cholelithiasis      Review of Systems neg for any significant sore throat, dysphagia, itching, sneezing, nasal congestion or excess/ purulent  secretions, fever, chills, sweats, unintended wt loss, pleuritic or exertional cp, hempoptysis, orthopnea pnd or change in chronic leg swelling. Also denies presyncope, palpitations, heartburn, abdominal pain, nausea, vomiting, diarrhea or change in bowel or urinary habits, dysuria,hematuria, rash, arthralgias, visual complaints, headache, numbness weakness or ataxia.     Objective:   Physical Exam  Gen. Pleasant, well-nourished, in no distress ENT - no lesions, no post nasal drip Neck: No JVD, no thyromegaly, no carotid bruits Lungs: no use of accessory muscles, no dullness to percussion, bibasal rales  Cardiovascular: Rhythm regular, heart sounds  normal, no murmurs or gallops, no peripheral edema Musculoskeletal: No deformities, no cyanosis or clubbing        Assessment & Plan:

## 2013-01-30 NOTE — Assessment & Plan Note (Addendum)
[facility] We discussed new medication- PERFENIDONE -will consider starting after reviewing scan Discussed side effects of diarrhea &  photosensitivity

## 2013-01-30 NOTE — Patient Instructions (Signed)
[facility] We discussed new medication- PERFENIDONE -will consider starting after reviewing scan

## 2013-02-01 ENCOUNTER — Ambulatory Visit (HOSPITAL_COMMUNITY): Payer: Medicare Other

## 2013-02-05 ENCOUNTER — Ambulatory Visit (HOSPITAL_COMMUNITY)
Admission: RE | Admit: 2013-02-05 | Discharge: 2013-02-05 | Disposition: A | Discharge status: Home / Self Care | Payer: Medicare Other | Source: Ambulatory Visit | Attending: Pulmonary Disease | Admitting: Pulmonary Disease

## 2013-02-05 ENCOUNTER — Other Ambulatory Visit: Payer: Self-pay | Admitting: Pulmonary Disease

## 2013-02-05 ENCOUNTER — Other Ambulatory Visit (HOSPITAL_COMMUNITY): Payer: Self-pay | Admitting: Oncology

## 2013-02-05 DIAGNOSIS — I2584 Coronary atherosclerosis due to calcified coronary lesion: Secondary | ICD-10-CM | POA: Insufficient documentation

## 2013-02-05 DIAGNOSIS — R0602 Shortness of breath: Secondary | ICD-10-CM | POA: Insufficient documentation

## 2013-02-05 DIAGNOSIS — J849 Interstitial pulmonary disease, unspecified: Secondary | ICD-10-CM

## 2013-02-05 DIAGNOSIS — J8410 Pulmonary fibrosis, unspecified: Secondary | ICD-10-CM | POA: Insufficient documentation

## 2013-02-23 ENCOUNTER — Encounter: Payer: Self-pay | Admitting: *Deleted

## 2013-02-23 ENCOUNTER — Telehealth: Payer: Self-pay | Admitting: Pulmonary Disease

## 2013-02-23 NOTE — Telephone Encounter (Signed)
See previous entry

## 2013-02-23 NOTE — Telephone Encounter (Signed)
atc line busy x 4 wcb 

## 2013-02-23 NOTE — Telephone Encounter (Signed)
Fine with me but needs to hold the fish oil until the cough is gone

## 2013-02-23 NOTE — Telephone Encounter (Signed)
Pt is aware that we will refill Tessalon. Will hold fish oil until cough is gone. Nothing further is needed.

## 2013-02-23 NOTE — Telephone Encounter (Signed)
I called spoke with pt. She reports the cough is worse. She wants a refill on benzonatate. She does not want OV d/t the weather. She has been taking mucinex and it helps Pt was seen 01/28/13 by RA. Please advise MW thanks  Allergies  Allergen Reactions  . Hydrocodone-Homatropine     Upset stomach  . Amoxicillin-Pot Clavulanate Rash  . Penicillins Rash  . Procardia [Nifedipine]     HA

## 2013-03-13 ENCOUNTER — Telehealth: Payer: Self-pay | Admitting: Pulmonary Disease

## 2013-03-13 NOTE — Telephone Encounter (Signed)
Pl address

## 2013-03-13 NOTE — Telephone Encounter (Signed)
Spoke with Tammy SoursGreg at [facility]--maintenance dose order was not checked on form. On the form, the [employer] Start Now program needs to be checked along with the Initial Titration Dose and Maintenance Dose boxes (these two boxes are the actual Rx for those 2 doses) ------------- Pt states that she has received her 45-Day Free trial of Esbriet and is now taking the 3 caps daily dose (30-day free dose)  Rx part of form changed (per Mindy) to Maintenance Dose Order with 5 refills.  This has been corrected and faxed back to [facility] [phone] for them to process the maintenance dose.  -------------- Pt states that this medication is starting to aggravate her Reflux later in the afternoon.  Would like recs from Dr Vassie LollAlva.

## 2013-03-13 NOTE — Telephone Encounter (Signed)
Can increase omeprazole to twice daily x 4 wks then back down to once daily

## 2013-03-13 NOTE — Telephone Encounter (Signed)
Called and spoke with pt. She reports the esbriet is messing w/ her acid reflux. She has noticed more indigestion and nausea. She takes omeprazole 20 mg QD. Per pt does not happen daily but when it does it is very uncomfortable. I advised her nausea is a side effect of this medication. Please advise RA thanks

## 2013-03-13 NOTE — Telephone Encounter (Signed)
Pt needing to speak to nurse can be reached at [phone].Joanne GriffinsStanley A Dalton

## 2013-03-13 NOTE — Telephone Encounter (Signed)
I called and made pt aware of recs; nothing further needed

## 2013-03-13 NOTE — Telephone Encounter (Signed)
[facility]  Pharm # [phone] needing clarification on perscript

## 2013-03-21 ENCOUNTER — Telehealth: Payer: Self-pay | Admitting: *Deleted

## 2013-03-21 NOTE — Telephone Encounter (Signed)
Received denial PA for pt esbriet from [facility]. ILD  is considered an experimental/investigational indication for this medication. In order for plan to reconsider dnial, a written request must be received from the member w/in 6 months of this letter written on 03/20/13. Please advise Dr. Vassie LollAlva thanks

## 2013-03-22 NOTE — Telephone Encounter (Signed)
Called [phone]. Was advised the patient has to request this and write a letter stating she wants to appeal the denial. Pt has to sign this with date. The letter must also contain pt [employer] ID #, DOB, name. Once pt has done this we can fax the letter with any medical records we want to send. We can't do peer to peer until letter is done and processed. Once a decision is made after the letter is sent in then we can request a peer to peer.  Fax # [phone]  lmtcb x1 for pt to make aware  Will also forward to Dr. Vassie LollAlva so he is aware.

## 2013-03-22 NOTE — Telephone Encounter (Signed)
Drop down to 2 tabs thrice daily - if still persistent, drop to 1 tab thrice daily Wait until next week to see if tolerated, before writing letter Arrange for oV with me or TP

## 2013-03-22 NOTE — Telephone Encounter (Signed)
Called spoke with pt. She is aware of RA recs. She will try dropping down and see how she feels. She will call next to let us know how she is doing. appt scheduled for 04/04/13 with TP.

## 2013-03-22 NOTE — Telephone Encounter (Signed)
Pt called back. She reports she is having trouble with taking this medication. It is causing a lot of upset stomach, indigestion, no appetite. She has increased her omeprazole and is still having problems. She is miserable since taking this medication and not sure if she is wanting to continue this medication. She is in the second week of taking 3 capsules 3 times daily. She wants to know if she needs to continue this before she does the letter? Please advise Dr. Vassie LollAlva thanks

## 2013-03-22 NOTE — Telephone Encounter (Signed)
Pl challenge denial & get me number to talk to MD

## 2013-03-30 ENCOUNTER — Telehealth: Payer: Self-pay | Admitting: Pulmonary Disease

## 2013-03-30 NOTE — Telephone Encounter (Signed)
Called and spoke with pt. She reports she is still not able to tolerate the esbriet. She is constantly having reflux/gas/loss of appetite. She has not dropped down to 1 tab TID yet. She will start this tomorrow and see how this makes her feels. She has pending appt next week. FYI for us

## 2013-04-04 ENCOUNTER — Ambulatory Visit: Payer: Medicare Other | Admitting: Adult Health

## 2013-04-05 ENCOUNTER — Ambulatory Visit: Payer: Medicare Other | Admitting: Adult Health

## 2013-04-10 ENCOUNTER — Ambulatory Visit: Payer: Medicare Other | Admitting: Adult Health

## 2013-04-13 ENCOUNTER — Ambulatory Visit (INDEPENDENT_AMBULATORY_CARE_PROVIDER_SITE_OTHER): Payer: Medicare Other | Admitting: Adult Health

## 2013-04-13 ENCOUNTER — Encounter: Payer: Self-pay | Admitting: Adult Health

## 2013-04-13 VITALS — BP 130/80 | HR 69 | Temp 98.1°F | Ht 62.0 in | Wt 136.8 lb

## 2013-04-13 DIAGNOSIS — J8410 Pulmonary fibrosis, unspecified: Secondary | ICD-10-CM

## 2013-04-13 DIAGNOSIS — J849 Interstitial pulmonary disease, unspecified: Secondary | ICD-10-CM

## 2013-04-13 NOTE — Patient Instructions (Addendum)
Continue on current regimen  I will call regarding lab results.  follow up Dr. Vassie LollAlva  In 3 months and As needed     Stop Esbriet  Ov in 4 weeks with LFT

## 2013-04-18 LAB — PULMONARY FUNCTION TEST
DL/VA % pred: 74 %
DL/VA: 3.4 ml/min/mmHg/L
DLCO unc % pred: 51 %
DLCO unc: 11.17 ml/min/mmHg
FEF 25-75 Post: 2.39 L/sec
FEF 25-75 Pre: 2 L/sec
FEF2575-%Change-Post: 19 %
FEF2575-%Pred-Post: 164 %
FEF2575-%Pred-Pre: 137 %
FEV1-%Change-Post: 3 %
FEV1-%Pred-Post: 88 %
FEV1-%Pred-Pre: 86 %
FEV1-Post: 1.65 L
FEV1-Pre: 1.6 L
FEV1FVC-%Change-Post: 0 %
FEV1FVC-%Pred-Pre: 115 %
FEV6-%Change-Post: 2 %
FEV6-%Pred-Post: 81 %
FEV6-%Pred-Pre: 79 %
FEV6-Post: 1.92 L
FEV6-Pre: 1.88 L
FEV6FVC-%Pred-Post: 106 %
FEV6FVC-%Pred-Pre: 106 %
FVC-%Change-Post: 2 %
FVC-%Pred-Post: 76 %
FVC-%Pred-Pre: 75 %
FVC-Post: 1.92 L
Post FEV1/FVC ratio: 86 %
Post FEV6/FVC ratio: 100 %
Pre FEV1/FVC ratio: 85 %
Pre FEV6/FVC Ratio: 100 %
RV % pred: 47 %
RV: 1.07 L
TLC % pred: 62 %
TLC: 2.95 L

## 2013-04-18 NOTE — Assessment & Plan Note (Signed)
ILD  >Progressive changes of ILD  on CT along w/ drop in DLCO (not on O2)  < Esbriet rx 02/2013, unable to tolerate full dose , on low dose.  Pt is on low dose with LFT elevation (also on statin)  Case discussed with Dr. Vassie LollAlva  In detail . Pt will hold esbriet for now  follow up with Dr. Vassie LollAlva  In 4 weeks , recheck LFT and discuss next step

## 2013-04-18 NOTE — Progress Notes (Signed)
 Subjective:    Patient ID: [name], female    DOB: 09/27/1933, 79 y.o.   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 There were no groundglass opacities noted. Bilateral lymph nodes were calcified and splenic granulomas were noted .  CT sinuses on 7/12 showed mild mucosal thickening. Her symptoms of reflux are well controlled on omeprazole.  ANA pos, ACE level 18, ESR 31, RA < 10, hypersens panel neg  03/29/2011 PFts- no airway obstruction, preserved lung volumes,  DLCO 59% corrects for alveolar volume  Reviewed imaging going back to 2005 ([facility]- for arthroscopy) show some bibasal & RUl scarring  09/26/12 PFTs stable - DLCO maintained at 16.4 - 56%    01/30/13 4m FU   PFTS 10/2012 show mild drop in FVC from 2.25 to 1.88 & TLC from 3.6 to 2.95  DLCO dropped to 11.1 also % decreased from 59 to 51   6MW -450 m She denies any heartburn, abdominal pain, nausea, vomiting, or edema.  She says she is very active and has had. No increased shortness, of breath  Chest x-ray 05/2012  showed chronic interstitial lung disease with basilar predominant pulmonary fibrosis, with no acute changes noted. >>CT chest >Peripheral pattern of pulmonary fibrosis is progressive from  02/04/2011 and indicative of usual interstitial pneumonitis (UIP , Rx Esbriet   04/13/13 Follow up ILD  Pt returns for follow up for ILD .  Last CT chest on 02/05/13 showed progressive fibrosis changes .  CT chest >Peripheral pattern of pulmonary fibrosis is progressive from  02/04/2011 and indicative of usual interstitial pneumonitis (UIP She was started on Esbriet . Unable to tolerate full dose d/t side effects.  Taking 1 tab Three times a day  . Is able to tolerate  this dose with minimal s/e.  Had recent labs done at PCP , noted bump in LFTs .(AST -90) She is also on statin rx . Denies myalgias .  Pt states breathing is unchanged. C/o DOE and dry cough in morning-no discolored mucus or fever.  . Denies CP, hemoptysis, orthopnea edema .  Not on O2.     Past Medical History  Diagnosis Date  . Supraventricular tachycardia   . GERD (gastroesophageal reflux disease)   . Hypertension   . DJD (degenerative joint disease), cervical   . Pneumonia, organism unspecified   . Rhinitis   . Osteopenia   . Lung disease     INTERSTITIAL LUNG DISEASE  . Degenerative disc disease, cervical   . Hyperlipidemia   . Degenerative disc disease, cervical   . Cholelithiasis      Review of Systems  neg for any significant sore throat, dysphagia, itching, sneezing, nasal congestion or excess/ purulent secretions, fever, chills, sweats, unintended wt loss, pleuritic or exertional cp, hempoptysis, orthopnea pnd or change in chronic leg swelling. Also denies presyncope, palpitations, heartburn, abdominal pain, nausea, vomiting, diarrhea or change in bowel or urinary habits, dysuria,hematuria, rash, arthralgias, visual complaints, headache, numbness weakness or ataxia.     Objective:   Physical Exam   Gen. Pleasant, well-nourished, in no distress, elderly  ENT - no lesions, no post nasal drip Neck: No JVD, no thyromegaly, no carotid bruits Lungs: no use of accessory muscles, no dullness to percussion, bibasal rales  Cardiovascular:   Rhythm regular, heart sounds  normal, no murmurs or gallops, no peripheral edema Musculoskeletal: No deformities, no cyanosis or clubbing        Assessment & Plan:   

## 2013-04-18 NOTE — Progress Notes (Signed)
Reviewed & agree with plan  

## 2013-04-23 ENCOUNTER — Telehealth: Payer: Self-pay | Admitting: Pulmonary Disease

## 2013-04-23 DIAGNOSIS — J849 Interstitial pulmonary disease, unspecified: Secondary | ICD-10-CM

## 2013-04-23 NOTE — Telephone Encounter (Signed)
Per OV note 04/13/13: Continue on current regimen   I will call regarding lab results.   follow up Dr. Vassie LollAlva  In 3 months and As needed  Stop Esbriet   Ov in 4 weeks with LFT ---  I do not see where pt had any labs done. Pt reports she was told she needed lab work done now. TP please advise thanks

## 2013-04-23 NOTE — Telephone Encounter (Signed)
Discussed with TP.  Pt had brought a copy of labs from PCP to the last office visit, and these were discussed and Esbriet was stopped.  Her LFT's are to be rechecked in 4 weeks when she returns for follow up.  Thanks.

## 2013-04-23 NOTE — Telephone Encounter (Signed)
Spoke with pt and she is aware. No appt was ever scheduled for her. She is scheduled to see RA 05/23/13. Nothing further needed

## 2013-05-23 ENCOUNTER — Encounter: Payer: Self-pay | Admitting: Pulmonary Disease

## 2013-05-23 ENCOUNTER — Other Ambulatory Visit (INDEPENDENT_AMBULATORY_CARE_PROVIDER_SITE_OTHER): Payer: Medicare Other

## 2013-05-23 ENCOUNTER — Ambulatory Visit (INDEPENDENT_AMBULATORY_CARE_PROVIDER_SITE_OTHER): Payer: Medicare Other | Admitting: Pulmonary Disease

## 2013-05-23 VITALS — BP 116/62 | HR 80 | Wt 137.2 lb

## 2013-05-23 DIAGNOSIS — J8410 Pulmonary fibrosis, unspecified: Secondary | ICD-10-CM

## 2013-05-23 DIAGNOSIS — J849 Interstitial pulmonary disease, unspecified: Secondary | ICD-10-CM

## 2013-05-23 LAB — HEPATIC FUNCTION PANEL
ALT: 87 U/L — AB (ref 0–35)
AST: 35 U/L (ref 0–37)
Albumin: 3.9 g/dL (ref 3.5–5.2)
Alkaline Phosphatase: 68 U/L (ref 39–117)
BILIRUBIN DIRECT: 0.1 mg/dL (ref 0.0–0.3)
BILIRUBIN TOTAL: 0.9 mg/dL (ref 0.2–1.2)
Total Protein: 7.2 g/dL (ref 6.0–8.3)

## 2013-05-23 MED ORDER — BENZONATATE 200 MG PO CAPS: 200.0000 mg | ORAL_CAPSULE | Freq: Three times a day (TID) | ORAL | Status: DC | PRN

## 2013-05-23 NOTE — Assessment & Plan Note (Addendum)
Breathing test next visit Your liver enzyme remains elevated - check with dr Everlena Cooperjaffe about stopping lovastatin OK to take cough drops- tessalon perles as needed She is not a candidate for OFEv either due tot he same reason -observe off meds for now & reconsider ofev on future visit

## 2013-05-23 NOTE — Patient Instructions (Signed)
Breathing test next visit Your liver enzyme remains elevated - check with dr Everlena Cooperjaffe about stopping lovastatin OK to take cough drops- tessalon perles as needed

## 2013-05-23 NOTE — Progress Notes (Signed)
   Subjective:    Patient ID: [name], female    DOB: [birth year], 80 y.o.   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 There were no groundglass opacities noted. Bilateral lymph nodes were calcified and splenic granulomas were noted .  Reviewed imaging going back to 2005 ([facility]- for arthroscopy) showed some bibasal & RUl scarring   ANA pos, ACE level 18, ESR 31, RA < 10, hypersens panel neg   03/29/2011 PFTs- no airway obstruction, preserved lung volumes, DLCO 59% corrects for alveolar volume  PFTS 10/2012 show mild drop in FVC from 2.25 to 1.88 & TLC from 3.6 to 2.95  DLCO dropped to 11.1 also % decreased from 59 to 51  6MW -450 m     05/2012 CT chest >Peripheral pattern of pulmonary fibrosis is progressive f  CT sinuses on 7/12 showed mild mucosal thickening. Her symptoms of reflux are well controlled on omeprazole.    05/23/2013  Chief Complaint  Patient presents with  . Follow-up    Pt reports she is feeling better since stopping esbriet. She has been off x april 10. C/o mostly dry cough. Wants refill on tess pearls.    She was started on Esbriet 02/2013 . Unable to tolerate full dose d/t side effects.   . Was able to tolerate  Taking 1 tab Three times a day with minimal s/e.  Due to elevated LFTs .(AST -90), perfenidone was stopped  She is also on statin rx . Denies myalgias .  Pt states breathing is unchanged. C/o DOE and dry cough in morning-no discolored mucus or fever.  . Denies CP, hemoptysis, orthopnea edema .  Not on O2.   Review of Systems neg for any significant sore throat, dysphagia, itching, sneezing, nasal congestion or excess/ purulent secretions, fever, chills, sweats, unintended wt loss, pleuritic or  exertional cp, hempoptysis, orthopnea pnd or change in chronic leg swelling. Also denies presyncope, palpitations, heartburn, abdominal pain, nausea, vomiting, diarrhea or change in bowel or urinary habits, dysuria,hematuria, rash, arthralgias, visual complaints, headache, numbness weakness or ataxia.     Objective:   Physical Exam  Gen. Pleasant, well-nourished, in no distress ENT - no lesions, no post nasal drip Neck: No JVD, no thyromegaly, no carotid bruits Lungs: no use of accessory muscles, no dullness to percussion, bibasal rales, no rhonchi  Cardiovascular: Rhythm regular, heart sounds  normal, no murmurs or gallops, no peripheral edema Musculoskeletal: No deformities, no cyanosis or clubbing        Assessment & Plan:

## 2013-05-24 ENCOUNTER — Other Ambulatory Visit: Payer: Self-pay | Admitting: Pulmonary Disease

## 2013-07-16 ENCOUNTER — Telehealth: Payer: Self-pay | Admitting: Pulmonary Disease

## 2013-07-16 DIAGNOSIS — K219 Gastro-esophageal reflux disease without esophagitis: Secondary | ICD-10-CM

## 2013-07-16 NOTE — Telephone Encounter (Signed)
Pt c/o constant cough. In the AM's at times she may cough up some mucus.  Saw PCP last week and reports lungs are clear. She thinks it is due to her acid reflux. Last time she saw GI in [place] 3 years ago. Requests a referral from us. Pt aware RA not curently in office and is fine with that. Please advise thanks

## 2013-07-17 NOTE — Telephone Encounter (Signed)
lmtcb x1 referral placed

## 2013-07-17 NOTE — Telephone Encounter (Signed)
Ok to refer.

## 2013-07-18 NOTE — Telephone Encounter (Signed)
lmtcb x2 

## 2013-07-18 NOTE — Telephone Encounter (Signed)
Pt returned call. Informed pt that the GI referral has been placed and that someone will be contacting her to schedule an appt. Pt verbalized understanding and denied any further questions or concerns at this time.

## 2013-08-06 ENCOUNTER — Ambulatory Visit (INDEPENDENT_AMBULATORY_CARE_PROVIDER_SITE_OTHER): Payer: Medicare Other | Admitting: Internal Medicine

## 2013-08-07 ENCOUNTER — Ambulatory Visit (INDEPENDENT_AMBULATORY_CARE_PROVIDER_SITE_OTHER): Payer: Medicare Other | Admitting: Internal Medicine

## 2013-08-07 ENCOUNTER — Encounter (INDEPENDENT_AMBULATORY_CARE_PROVIDER_SITE_OTHER): Payer: Self-pay | Admitting: Internal Medicine

## 2013-08-07 VITALS — BP 128/80 | HR 78 | Temp 97.0°F | Resp 18 | Ht 62.0 in | Wt 131.9 lb

## 2013-08-07 DIAGNOSIS — I10 Essential (primary) hypertension: Secondary | ICD-10-CM | POA: Insufficient documentation

## 2013-08-07 DIAGNOSIS — K219 Gastro-esophageal reflux disease without esophagitis: Secondary | ICD-10-CM

## 2013-08-07 DIAGNOSIS — M8580 Other specified disorders of bone density and structure, unspecified site: Secondary | ICD-10-CM | POA: Insufficient documentation

## 2013-08-07 MED ORDER — PANTOPRAZOLE SODIUM 40 MG PO TBEC: 40.0000 mg | DELAYED_RELEASE_TABLET | Freq: Two times a day (BID) | ORAL | Status: DC

## 2013-08-07 NOTE — Patient Instructions (Addendum)
Discontinue omeprazole. Take pantoprazole 40 mg by mouth 30 minutes before breakfast and evening meal daily. High fiber diet.

## 2013-08-07 NOTE — Progress Notes (Signed)
Presenting complaint;  Cough and frequent regurgitation.  History of present illness;  Patient is 80 year old Caucasian female who presents for GI evaluation. She has not been previously evaluated in the office but she did have colonoscopy back in May 2011. She has several year history of reflux esophagitis. She stays omeprazole is not working anymore. She believes her symptoms got worse when she was on esbriet for idiopathic primary fibrosis. This medication was discontinued about 7 weeks ago because of rise in transaminases. Transaminases have returned to normal on stopping this medication. However heartburn has not. She is having frequent heartburn if not daily. She is on bland diet. She also complains of regurgitation day and night. She has postprandial dry hacking cough and she has bad taste when she wakes up every morning. She has more cough after meals. She denies nausea vomiting or dysphagia. She has constant need to clear her throat. She has intermittent bouts of constipation and uses MOM on as-needed basis. She denies melena or rectal bleeding. She states she tries to be active as much as she can. She either walks or rides stationary bike daily. She is quite a bit of benefits of physical activity. Her appetite is fair. Her weight has been stable this year. She is scheduled for left cataract extraction in 2 days.   Current Medications: Outpatient Encounter Prescriptions as of 08/07/2013  Medication Sig  . acetaminophen (TYLENOL) 500 MG tablet Take 500 mg by mouth every 8 (eight) hours as needed for pain.  Marland Kitchen. aspirin 81 MG tablet Take 81 mg by mouth daily.  Marland Kitchen. atenolol (TENORMIN) 50 MG tablet Take 1 tablet in morning and 0.5 in the evening  . benzonatate (TESSALON) 200 MG capsule Take 1 capsule (200 mg total) by mouth 3 (three) times daily as needed for cough.  . carboxymethylcellulose (REFRESH TEARS) 0.5 % SOLN Place 1 drop into both eyes 3 (three) times daily as needed.  . cholecalciferol  (VITAMIN D) 1000 UNITS tablet Take 1,000 Units by mouth daily.  . cyclobenzaprine (FLEXERIL) 5 MG tablet Take 1 tablet by mouth at bedtime.  . docusate sodium (COLACE) 100 MG capsule Take 100 mg by mouth 2 (two) times daily.  . DUREZOL 0.05 % EMUL 1 drop. Left Eye  . GLUCOSAMINE PO Take 3,000 mg by mouth daily.  Marland Kitchen. guanFACINE (TENEX) 2 MG tablet Take 2 mg by mouth at bedtime.  Marland Kitchen. ipratropium (ATROVENT) 0.03 % nasal spray Place 1 spray into both nostrils daily.   Marland Kitchen. lovastatin (MEVACOR) 10 MG tablet Once daily  . Multiple Vitamin (MULTIVITAMIN WITH MINERALS) TABS Take 1 tablet by mouth daily.  Marland Kitchen. omega-3 acid ethyl esters (LOVAZA) 1 G capsule Take 1 g by mouth daily.  Marland Kitchen. omeprazole (PRILOSEC) 20 MG capsule Take 20 mg by mouth daily.  Marland Kitchen. PREVIDENT 5000 SENSITIVE 1.1-5 % PSTE at bedtime as needed.   . [DISCONTINUED] Coenzyme Q10 (CO Q10) 100 MG TABS Take 100 mg by mouth daily.  . [DISCONTINUED] sodium chloride (OCEAN) 0.65 % nasal spray Place 1 spray into the nose as needed.   Past medical history;  Hypertension of 30 years duration. Chronic low back pain secondary to DJD of lumbar spine. Chronic GERD. Last EGD was in 2002. H. pylori gastritis was diagnosed when she had EGD in 2002 and treated twice. She was diagnosed with idiopathic primary fibrosis and 2013 when she presented with pneumonia. She found out that she's had abnormal chest xray since 2006 but did not know until she reviewed her records.  Hyperlipidemia. She was on Lipitor for years but now on  Mevacor. Osteopenia. History of SVT with ablation in 05-31-06. In February 2014 she was admitted to [facility] for elevated transaminases and felt to have choledocholithiasis. She apparently passed stone spontaneously. She had laparoscopic cholecystectomy with negative IOC inmate last year by Dr. Gerrit Friends. Tonsillectomy at age 43. She had benign lesions removed from both breasts In late 1960s. Surgery for left ankle fracture in 2004/05/30. Right knee arthroscopy  in 2004/05/30. She had sinus surgery in 05/31/10.  Allergies; Allergies  Allergen Reactions  . Hydrocodone-Homatropine     Upset stomach  . Amoxicillin-Pot Clavulanate Rash  . Penicillins Rash  . Procardia [Nifedipine]     HA   Family history;  Father was diabetic and had CVA and died at age 24. Mother also died at age 55. She had colonic polyps and died of leukemia. One sister had surgery for colon carcinoma in her 50s and she is doing fine at 9. 2 sisters are deceased; 1 of MI at age 53 another one had lupus and multiple other problems but lived to be 31. She has a brother with history of colonic polyps. Her twin brother died in 30-May-2000 at age 46 of ARDS. He possibly also had alcoholic liver disease.  Social history;  She is divorced and does not have any children. 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 She smokes cigarettes for 25 years; less than one pack per day and quit in 1980/05/30. He used to drink alcohol socially but hasn't had any in over 15 years.   Objective: Blood pressure 128/80, pulse 78, temperature 97 F (36.1 C), temperature source Oral, resp. rate 18, height 5\' 2"  (1.575 m), weight 131 lb 14.4 oz (59.829 kg). Patient is alert and in no acute distress. Conjunctiva is pink. Sclera is nonicteric Oropharyngeal mucosa is normal. No neck masses or thyromegaly noted. Cardiac exam with regular rhythm normal S1 and S2. No murmur or gallop noted. Auscultation of lungs revealed dry crackles bilaterally but more pronounced on the right side below the scapula. Abdomen is symmetrical. Bowel sounds are normal. No bruit noted. Abdomen is soft and nontender without organomegaly or masses.  No LE edema or clubbing noted.  Labs/studies Results: LFTs from 05/23/2013 noted. Bilirubin 0.9, AP 68, AST 35, ALT 87 and albumin 3.9. Patient states subsequent LFTs are normal. She forgot to bring in report with her.     Assessment:  #1.  Gastroesophageal reflux disease. Patient has dry hacking cough which is worse after meals and she is also having frequent episodes of regurgitation. Therefore her cough would appear to be secondary to GERD carotid then pulmonary disease. She has been on omeprazole for several years which is probably not working anymore. If she does not respond to another PPI she will need diagnostic EGD. #2. Family history of colon carcinoma and colonic adenomas. Last colonoscopy was 4 years ago.  Plan:  Discontinue omeprazole. Pantoprazole 40 mg by mouth twice a day. High fiber diet. Call if pantoprazole has not worked by week two. Office visit in 3 months.

## 2013-08-08 DIAGNOSIS — E785 Hyperlipidemia, unspecified: Secondary | ICD-10-CM | POA: Insufficient documentation

## 2013-08-27 ENCOUNTER — Encounter (INDEPENDENT_AMBULATORY_CARE_PROVIDER_SITE_OTHER): Payer: Self-pay

## 2013-09-20 ENCOUNTER — Telehealth (INDEPENDENT_AMBULATORY_CARE_PROVIDER_SITE_OTHER): Payer: Self-pay | Admitting: *Deleted

## 2013-09-20 NOTE — Telephone Encounter (Signed)
A prior Auth was completed for patient. She is approved per [place]  With [employer]. Dates of approval are 07/22/13-09-1614. [facility]/[place] was made aware.

## 2013-11-07 ENCOUNTER — Encounter (INDEPENDENT_AMBULATORY_CARE_PROVIDER_SITE_OTHER): Payer: Self-pay | Admitting: Internal Medicine

## 2013-11-07 ENCOUNTER — Encounter (INDEPENDENT_AMBULATORY_CARE_PROVIDER_SITE_OTHER): Payer: Self-pay | Admitting: *Deleted

## 2013-11-07 ENCOUNTER — Other Ambulatory Visit (INDEPENDENT_AMBULATORY_CARE_PROVIDER_SITE_OTHER): Payer: Self-pay | Admitting: *Deleted

## 2013-11-07 ENCOUNTER — Ambulatory Visit (INDEPENDENT_AMBULATORY_CARE_PROVIDER_SITE_OTHER): Payer: Medicare Other | Admitting: Internal Medicine

## 2013-11-07 ENCOUNTER — Other Ambulatory Visit: Payer: Self-pay | Admitting: Pulmonary Disease

## 2013-11-07 VITALS — BP 110/60 | HR 56 | Temp 97.5°F | Ht 62.0 in | Wt 133.0 lb

## 2013-11-07 DIAGNOSIS — K219 Gastro-esophageal reflux disease without esophagitis: Secondary | ICD-10-CM

## 2013-11-07 DIAGNOSIS — J849 Interstitial pulmonary disease, unspecified: Secondary | ICD-10-CM

## 2013-11-07 NOTE — Progress Notes (Signed)
Subjective:    Patient ID: [name], female    DOB: 1933-10-22, 80 y.o.   MRN: [id number]  HPI Here today for f/u of her GERD. Hx of reflux esophagitis. Seen in August of this year by Dr. Karilyn Cotaehman. Her omeprazole was switched to Protoonix twice a day. She says the Protonix is not helping. She says she is coughing.  She doesn't feel like eating. Some night she has to get up and vomit.  She says the cough she feels is more in her throat than her chest.  Hx of idiopathy pulmonary fibrosis.  She was on esbriet but her transaminases increrased. Since she stopped the medications her transaminases have returned to normal.  She has frequent heartburn.  She can feel the acid reflux in her throat.  She says she coughs when she eats or drinks. There is ino dysphagia.  Her BMs are normal.  There has been no weight loss. She had cataract surgery in August both eyes. She has been treated for H. Pylori x 2 in 2001 or 2002.  Colonoscopy in 2011.       Review of Systems Past Medical History  Diagnosis Date  . Supraventricular tachycardia   . GERD (gastroesophageal reflux disease)   . Hypertension   . DJD (degenerative joint disease), cervical   . Pneumonia, organism unspecified   . Rhinitis   . Osteopenia   . Lung disease     INTERSTITIAL LUNG DISEASE  . Degenerative disc disease, cervical   . Hyperlipidemia   . Degenerative disc disease, cervical   . Cholelithiasis     Past Surgical History  Procedure Laterality Date  . Nasal sinus surgery  July 2012  . Heart ablation  2008  . Tonsillectomy    . Knee surgery      right KNEE ARTHROSCOPY  . Ankle surgery    . Dilation and curettage of uterus    . Tonsilectomy, adenoidectomy, bilateral myringotomy and tubes    . Breast surgery      BIOPSIES-BENIGN  . Cholecystectomy N/A 03/21/2012    Procedure: LAPAROSCOPIC CHOLECYSTECTOMY WITH INTRAOPERATIVE CHOLANGIOGRAM;  Surgeon: Velora Hecklerodd M Gerkin, MD;  Location: [facility];  Service: General;  Laterality:  N/A;    Allergies  Allergen Reactions  . Hydrocodone-Homatropine     Upset stomach  . Amoxicillin-Pot Clavulanate Rash  . Penicillins Rash  . Procardia [Nifedipine]     HA    Current Outpatient Prescriptions on File Prior to Visit  Medication Sig Dispense Refill  . acetaminophen (TYLENOL) 500 MG tablet Take 500 mg by mouth every 8 (eight) hours as needed for pain.    Marland Kitchen. aspirin 81 MG tablet Take 81 mg by mouth daily.    Marland Kitchen. atenolol (TENORMIN) 50 MG tablet Take 1 tablet in morning and 0.5 in the evening    . benzonatate (TESSALON) 200 MG capsule Take 1 capsule (200 mg total) by mouth 3 (three) times daily as needed for cough. 90 capsule 0  . carboxymethylcellulose (REFRESH TEARS) 0.5 % SOLN Place 1 drop into both eyes 3 (three) times daily as needed.    . cholecalciferol (VITAMIN D) 1000 UNITS tablet Take 1,000 Units by mouth daily.    . cyclobenzaprine (FLEXERIL) 5 MG tablet Take 1 tablet by mouth as needed.     . docusate sodium (COLACE) 100 MG capsule Take 100 mg by mouth 2 (two) times daily.    Marland Kitchen. GLUCOSAMINE PO Take 3,000 mg by mouth daily.    Marland Kitchen. guanFACINE (TENEX)  2 MG tablet Take 2 mg by mouth at bedtime.    Marland Kitchen. ipratropium (ATROVENT) 0.03 % nasal spray Place 1 spray into both nostrils daily.     Marland Kitchen. lovastatin (MEVACOR) 10 MG tablet Once daily    . Multiple Vitamin (MULTIVITAMIN WITH MINERALS) TABS Take 1 tablet by mouth daily.    Marland Kitchen. omega-3 acid ethyl esters (LOVAZA) 1 G capsule Take 1 g by mouth daily.    . pantoprazole (PROTONIX) 40 MG tablet Take 1 tablet (40 mg total) by mouth 2 (two) times daily before a meal. 60 tablet 3  . PREVIDENT 5000 SENSITIVE 1.1-5 % PSTE at bedtime as needed.      No current facility-administered medications on file prior to visit.        Objective:   Physical Exam  Filed Vitals:   11/07/13 1440  Height: 5\' 2"  (1.575 m)  Weight: 133 lb (60.328 kg)   Alert and oriented. Skin warm and dry. Oral mucosa is moist.   . Sclera anicteric, conjunctivae  is pink. Thyroid not enlarged. No cervical lymphadenopathy. Lungs clear. Heart regular rate and rhythm.  Abdomen is soft. Bowel sounds are positive. No hepatomegaly. No abdominal masses felt. No tenderness.  No edema to lower extremities.          Assessment & Plan:  GERD not controlled at this time with PPI BID.   Diagnostic EGD

## 2013-11-07 NOTE — Patient Instructions (Signed)
Diagnostic EGD. The risks and benefits such as perforation, bleeding, and infection were reviewed with the patient and is agreeable.

## 2013-11-08 ENCOUNTER — Ambulatory Visit (INDEPENDENT_AMBULATORY_CARE_PROVIDER_SITE_OTHER): Payer: Medicare Other | Admitting: Pulmonary Disease

## 2013-11-08 DIAGNOSIS — J849 Interstitial pulmonary disease, unspecified: Secondary | ICD-10-CM | POA: Diagnosis not present

## 2013-11-08 LAB — PULMONARY FUNCTION TEST
DL/VA % pred: 67 %
DL/VA: 3.04 ml/min/mmHg/L
DLCO unc % pred: 43 %
DLCO unc: 9.49 ml/min/mmHg
FEF 25-75 PRE: 1.91 L/s
FEF 25-75 Post: 2.22 L/sec
FEF2575-%CHANGE-POST: 16 %
FEF2575-%PRED-PRE: 136 %
FEF2575-%Pred-Post: 158 %
FEV1-%CHANGE-POST: 3 %
FEV1-%Pred-Post: 84 %
FEV1-%Pred-Pre: 81 %
FEV1-Post: 1.54 L
FEV1-Pre: 1.49 L
FEV1FVC-%Change-Post: 0 %
FEV1FVC-%Pred-Pre: 117 %
FEV6-%CHANGE-POST: 3 %
FEV6-%PRED-POST: 76 %
FEV6-%PRED-PRE: 73 %
FEV6-POST: 1.78 L
FEV6-PRE: 1.71 L
FEV6FVC-%PRED-POST: 106 %
FEV6FVC-%PRED-PRE: 106 %
FVC-%Change-Post: 3 %
FVC-%PRED-POST: 72 %
FVC-%PRED-PRE: 69 %
FVC-PRE: 1.71 L
FVC-Post: 1.78 L
POST FEV1/FVC RATIO: 86 %
POST FEV6/FVC RATIO: 100 %
Pre FEV1/FVC ratio: 87 %
Pre FEV6/FVC Ratio: 100 %
RV % PRED: 48 %
RV: 1.1 L
TLC % PRED: 63 %
TLC: 3.02 L

## 2013-11-08 NOTE — Progress Notes (Signed)
PFT done today. 

## 2013-11-20 ENCOUNTER — Ambulatory Visit (INDEPENDENT_AMBULATORY_CARE_PROVIDER_SITE_OTHER): Payer: Medicare Other | Admitting: Pulmonary Disease

## 2013-11-20 ENCOUNTER — Encounter: Payer: Self-pay | Admitting: Pulmonary Disease

## 2013-11-20 VITALS — BP 94/62 | HR 76 | Ht 62.0 in | Wt 135.8 lb

## 2013-11-20 DIAGNOSIS — J849 Interstitial pulmonary disease, unspecified: Secondary | ICD-10-CM

## 2013-11-20 DIAGNOSIS — R0602 Shortness of breath: Secondary | ICD-10-CM

## 2013-11-20 NOTE — Patient Instructions (Signed)
Breathing test in 6 months AMbulatory satn Call if worse

## 2013-11-20 NOTE — Progress Notes (Signed)
   Subjective:    Patient ID: [name], female    DOB: 1933/09/14, 80 y.o.   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 There were no groundglass opacities noted. Bilateral lymph nodes were calcified and splenic granulomas were noted .  05/2012 CT chest >Peripheral pattern of pulmonary fibrosis is progressive  ANA pos, ACE level 18, ESR 31, RA < 10, hypersens panel neg   03/29/2011 PFTs- no airway obstruction, preserved lung volumes, DLCO 59% corrects for alveolar volume  PFTS 10/2012 show mild drop in FVC from 2.25 to 1.88 & TLC from 3.6 to 2.95  DLCO dropped to 11.1 also % decreased from 59 to 51  6MW -450 m   CT sinuses on 07/2010 showed mild mucosal thickening. Her symptoms of reflux are well controlled on omeprazole.     11/20/2013  Chief Complaint  Patient presents with  . Follow-up    f/u ILD; biggest complaint is reflux; cough; Esbriet caused stomach problems and she says her stomach has not gotten back to normal from it.     02/2013  started on Esbriet  . Unable to tolerate full dose d/t side effects. . Was able to take 1 tab Three times a day  Due to elevated LFTs .(AST -90), perfenidone was stopped She is also on statin rx .  11/2013 PFTs - preserved FVC & TLC but DLCO dropping further to 43%- 9.5 from 11.1 earlier She feels good , no dyspnea or cough  Review of Systems neg for any significant sore throat, dysphagia, itching, sneezing, nasal congestion or excess/ purulent secretions, fever, chills, sweats, unintended wt loss, pleuritic or exertional cp, hempoptysis, orthopnea pnd or change in chronic leg swelling. Also  denies presyncope, palpitations, heartburn, abdominal pain, nausea, vomiting, diarrhea or change in bowel or urinary habits, dysuria,hematuria, rash, arthralgias, visual complaints, headache, numbness weakness or ataxia.     Objective:   Physical Exam  Gen. Pleasant, well-nourished, in no distress ENT - no lesions, no post nasal drip Neck: No JVD, no thyromegaly, no carotid bruits Lungs: no use of accessory muscles, no dullness to percussion, bibasal rales, no rhonchi  Cardiovascular: Rhythm regular, heart sounds  normal, no murmurs or gallops, no peripheral edema Musculoskeletal: No deformities, no cyanosis or clubbing          Assessment & Plan:

## 2013-11-22 ENCOUNTER — Ambulatory Visit (HOSPITAL_COMMUNITY)
Admission: RE | Admit: 2013-11-22 | Discharge: 2013-11-22 | Disposition: A | Discharge status: Home / Self Care | Payer: Medicare Other | Source: Ambulatory Visit | Attending: Internal Medicine | Admitting: Internal Medicine

## 2013-11-22 ENCOUNTER — Encounter (HOSPITAL_COMMUNITY): Payer: Self-pay | Admitting: *Deleted

## 2013-11-22 ENCOUNTER — Encounter (HOSPITAL_COMMUNITY)
Admission: RE | Disposition: A | Discharge status: Home / Self Care | Payer: Self-pay | Source: Ambulatory Visit | Attending: Internal Medicine

## 2013-11-22 DIAGNOSIS — K219 Gastro-esophageal reflux disease without esophagitis: Secondary | ICD-10-CM | POA: Diagnosis present

## 2013-11-22 DIAGNOSIS — I10 Essential (primary) hypertension: Secondary | ICD-10-CM | POA: Diagnosis not present

## 2013-11-22 DIAGNOSIS — K2960 Other gastritis without bleeding: Secondary | ICD-10-CM | POA: Diagnosis not present

## 2013-11-22 DIAGNOSIS — E785 Hyperlipidemia, unspecified: Secondary | ICD-10-CM | POA: Insufficient documentation

## 2013-11-22 DIAGNOSIS — K449 Diaphragmatic hernia without obstruction or gangrene: Secondary | ICD-10-CM | POA: Insufficient documentation

## 2013-11-22 DIAGNOSIS — Z800 Family history of malignant neoplasm of digestive organs: Secondary | ICD-10-CM | POA: Insufficient documentation

## 2013-11-22 DIAGNOSIS — Z87891 Personal history of nicotine dependence: Secondary | ICD-10-CM | POA: Diagnosis not present

## 2013-11-22 DIAGNOSIS — K3189 Other diseases of stomach and duodenum: Secondary | ICD-10-CM

## 2013-11-22 DIAGNOSIS — R1110 Vomiting, unspecified: Secondary | ICD-10-CM | POA: Insufficient documentation

## 2013-11-22 DIAGNOSIS — Z79899 Other long term (current) drug therapy: Secondary | ICD-10-CM | POA: Insufficient documentation

## 2013-11-22 DIAGNOSIS — Z7982 Long term (current) use of aspirin: Secondary | ICD-10-CM | POA: Insufficient documentation

## 2013-11-22 HISTORY — PX: ESOPHAGOGASTRODUODENOSCOPY: SHX5428

## 2013-11-22 SURGERY — EGD (ESOPHAGOGASTRODUODENOSCOPY)
Anesthesia: Moderate Sedation

## 2013-11-22 MED ORDER — MEPERIDINE HCL 50 MG/ML IJ SOLN
INTRAMUSCULAR | Status: DC | PRN
  Administered 2013-11-22 (×2): 25 mg via INTRAVENOUS

## 2013-11-22 MED ORDER — BUTAMBEN-TETRACAINE-BENZOCAINE 2-2-14 % EX AERO
INHALATION_SPRAY | CUTANEOUS | Status: DC | PRN
  Administered 2013-11-22: 2 via TOPICAL

## 2013-11-22 MED ORDER — SODIUM CHLORIDE 0.9 % IV SOLN
INTRAVENOUS | Status: DC
  Administered 2013-11-22: 12:00:00 via INTRAVENOUS

## 2013-11-22 MED ORDER — MEPERIDINE HCL 50 MG/ML IJ SOLN
INTRAMUSCULAR | Status: AC
  Filled 2013-11-22: qty 1

## 2013-11-22 MED ORDER — MIDAZOLAM HCL 5 MG/5ML IJ SOLN
INTRAMUSCULAR | Status: DC | PRN
  Administered 2013-11-22: 2 mg via INTRAVENOUS
  Administered 2013-11-22: 1 mg via INTRAVENOUS

## 2013-11-22 MED ORDER — SIMETHICONE 40 MG/0.6ML PO SUSP
ORAL | Status: DC | PRN
  Administered 2013-11-22: 14:00:00

## 2013-11-22 MED ORDER — METOCLOPRAMIDE HCL 10 MG PO TABS: 10.0000 mg | ORAL_TABLET | Freq: Every day | ORAL | Status: DC

## 2013-11-22 MED ORDER — MIDAZOLAM HCL 5 MG/5ML IJ SOLN
INTRAMUSCULAR | Status: AC
  Filled 2013-11-22: qty 10

## 2013-11-22 NOTE — H&P (Signed)
[name] is an 80 y.o. female.   Chief Complaint:  Patient's here for EGD. HPI: Patient is 80 year old Caucasian female was 7 years of GERD and her symptoms are not well controlled with therapy. She was seen back in August and switch from omeprazole to pantoprazole which provided relief for a few days. She is taking it twice daily but she has heartburn at least 2-3 times week and she has very frequent nocturnal regurgitation that time she has to get up and vomited food. She eats her evening meal at 5 feet 11 goes to bed after 10 PM. She denies dysphagia melena or rectal bleeding. She also has intermittent cough.  Past Medical History  Diagnosis Date  . Supraventricular tachycardia   . GERD (gastroesophageal reflux disease)   . Hypertension   . DJD (degenerative joint disease), cervical   . Pneumonia, organism unspecified   . Rhinitis   . Osteopenia   . Lung disease     INTERSTITIAL LUNG DISEASE  . Degenerative disc disease, cervical   . Hyperlipidemia   . Degenerative disc disease, cervical   . Cholelithiasis     Past Surgical History  Procedure Laterality Date  . Nasal sinus surgery  July 2012  . Heart ablation  2008  . Tonsillectomy    . Knee surgery      right KNEE ARTHROSCOPY  . Ankle surgery    . Dilation and curettage of uterus    . Tonsillectomy and adenoidectomy    . Breast surgery      BIOPSIES-BENIGN  . Cholecystectomy N/A 03/21/2012    Procedure: LAPAROSCOPIC CHOLECYSTECTOMY WITH INTRAOPERATIVE CHOLANGIOGRAM;  Surgeon: Velora Hecklerodd M Gerkin, MD;  Location: [facility];  Service: General;  Laterality: N/A;    Family History  Problem Relation Age of Onset  . Diabetes Father   . Stroke Father   . Leukemia Mother   . Lupus Sister   . Heart disease Sister   . Arthritis Sister   . Heart attack Sister   . Colon cancer Sister   . Seizures Sister   . Lung disease Brother   . Colon polyps Brother   . Gout Brother   . Hypertension Brother    Social History:  reports that she  quit smoking about 33 years ago. She has never used smokeless tobacco. She reports that she does not drink alcohol or use illicit drugs.  Allergies:  Allergies  Allergen Reactions  . Hydrocodone-Homatropine     Upset stomach  . Amoxicillin-Pot Clavulanate Rash  . Penicillins Rash  . Procardia [Nifedipine]     HA    Medications Prior to Admission  Medication Sig Dispense Refill  . acetaminophen (TYLENOL) 500 MG tablet Take 500 mg by mouth every 8 (eight) hours as needed for pain.    Marland Kitchen. aspirin 81 MG tablet Take 81 mg by mouth daily.    Marland Kitchen. atenolol (TENORMIN) 50 MG tablet Take 0.5 tablet in morning and 1 in the evening    . benzonatate (TESSALON) 200 MG capsule Take 1 capsule (200 mg total) by mouth 3 (three) times daily as needed for cough. 90 capsule 0  . carboxymethylcellulose (REFRESH TEARS) 0.5 % SOLN Place 1 drop into both eyes 3 (three) times daily as needed.    . cholecalciferol (VITAMIN D) 1000 UNITS tablet Take 1,000 Units by mouth daily.    . cyclobenzaprine (FLEXERIL) 5 MG tablet Take 1 tablet by mouth daily as needed for muscle spasms.     Marland Kitchen. docusate sodium (COLACE)  100 MG capsule Take 100 mg by mouth 2 (two) times daily.    Marland Kitchen. GLUCOSAMINE PO Take 3,000 mg by mouth daily.    Marland Kitchen. guanFACINE (TENEX) 2 MG tablet Take 2 mg by mouth at bedtime.    Marland Kitchen. ipratropium (ATROVENT) 0.03 % nasal spray Place 1 spray into both nostrils daily.     Marland Kitchen. lovastatin (MEVACOR) 10 MG tablet Once daily    . Multiple Vitamin (MULTIVITAMIN WITH MINERALS) TABS Take 1 tablet by mouth daily.    Marland Kitchen. omega-3 acid ethyl esters (LOVAZA) 1 G capsule Take 1 g by mouth daily.    . pantoprazole (PROTONIX) 40 MG tablet Take 1 tablet (40 mg total) by mouth 2 (two) times daily before a meal. 60 tablet 3  . PREVIDENT 5000 SENSITIVE 1.1-5 % PSTE at bedtime as needed.       No results found for this or any previous visit (from the past 48 hour(s)). No results found.  ROS  Blood pressure 158/77, pulse 78, temperature 97.3  F (36.3 C), temperature source Oral, resp. rate 20, height 5\' 2"  (1.575 m), weight 135 lb (61.236 kg), SpO2 98 %. Physical Exam  Constitutional: She appears well-developed and well-nourished.  HENT:  Mouth/Throat: Oropharynx is clear and moist.  Eyes: Conjunctivae are normal. No scleral icterus.  Neck: No thyromegaly present.  Cardiovascular: Normal rate, regular rhythm and normal heart sounds.   No murmur heard. Respiratory: Effort normal and breath sounds normal.  GI: Soft. She exhibits no distension and no mass. There is no tenderness.  Musculoskeletal: She exhibits no edema.  Lymphadenopathy:    She has no cervical adenopathy.  Neurological: She is alert.  Skin: Skin is warm and dry.     Assessment/Plan Refractory GERD. Diagnostic EGD.  Atia Haupt U 11/22/2013, 1:28 PM

## 2013-11-22 NOTE — Op Note (Signed)
EGD PROCEDURE REPORT  PATIENT:  Joanne RilingBetty [name]  MR#:  [id number] Birthdate:  [birth year], 80 y.o., female Endoscopist:  Dr. Malissa HippoNajeeb U. Rehman, MD Referred By:  Dr. Jenean Lindauwana R. Louellen MolderJaff, MD Procedure Date: 11/22/2013  Procedure:   EGD  Indications:  Patient is 80 year old Caucasian female was chronic GERD and symptoms are poorly controlled with therapy. She has nocturnal regurgitation with intermittent vomiting. She can needs to complain of postprandial fullness and feels worse food stays in stomach for long. She denies hematemesis melena or rectal bleeding.            Informed Consent:  The risks, benefits, alternatives & imponderables which include, but are not limited to, bleeding, infection, perforation, drug reaction and potential missed lesion have been reviewed.  The potential for biopsy, lesion removal, esophageal dilation, etc. have also been discussed.  Questions have been answered.  All parties agreeable.  Please see history & physical in medical record for more information.  Medications:  Demerol 50 mg IV Versed 3 mg IV Cetacaine spray topically for oropharyngeal anesthesia  Description of procedure:  The endoscope was introduced through the mouth and advanced to the second portion of the duodenum without difficulty or limitations. The mucosal surfaces were surveyed very carefully during advancement of the scope and upon withdrawal.  Findings:  Esophagus:  Mucosa of the esophagus was normal. GE junction was unremarkable. GEJ:  35 cm Hiatus:  37 cm Stomach:  Stomach was empty and distended very well with insufflation. Folds in the proximal stomach are normal. Examination mucosa gastric body was normal. There is patchy linear erythema involving antral mucosa. Small whitish plaques noted in the prepyloric region consistent with intestinal metaplasia.  Angularis fundus and cardia were unremarkable. Duodenum:  Normal bulbar and post bulbar mucosa.  Therapeutic/Diagnostic Maneuvers Performed:   Biopsy was taken from prepyloric/antral mucosa. Pyloric channel was patent  Complications:  None  Impression: Small sliding hiatal hernia without evidence of erosive esophagitis. Nonerosive antral gastritis with changes suggestive and intestinal metaplasia and prepyloric region.  Comment; Patient may have gastroparesis. If she does not respond to different PPI and low-dose metoclopramide will consider solid-phase gastric emptying study.  Recommendations:  Continue antireflux measures as before. Discontinue pantoprazole for now. Dexilant 60 mg by mouth every morning. Patient will pick up 20 doses from the office. Metoclopramide 10 mg by mouth 30 minutes before evening meal daily. Patient and her sister informed of potential side effects and if she has any she will stop the medication and let us know. I will contact patient with biopsy results and further recommendations.   REHMAN,NAJEEB U  11/22/2013  1:49 PM  CC: Dr. Homero FellersJaff, Twana R, MD & Dr. Bonnetta BarryNo ref. provider found

## 2013-11-22 NOTE — Discharge Instructions (Signed)
Resume usual medications and diet. Dexilant 60 mg by mouth 30 minutes before breakfast daily(samples from office). Do not take pantoprazole while on Dexilant. No driving for 24 hours. Physician will call with biopsy results.  Gastroesophageal Reflux Disease, Adult Gastroesophageal reflux disease (GERD) happens when acid from your stomach flows up into the esophagus. When acid comes in contact with the esophagus, the acid causes soreness (inflammation) in the esophagus. Over time, GERD may create small holes (ulcers) in the lining of the esophagus. CAUSES   Increased body weight. This puts pressure on the stomach, making acid rise from the stomach into the esophagus.  Smoking. This increases acid production in the stomach.  Drinking alcohol. This causes decreased pressure in the lower esophageal sphincter (valve or ring of muscle between the esophagus and stomach), allowing acid from the stomach into the esophagus.  Late evening meals and a full stomach. This increases pressure and acid production in the stomach.  A malformed lower esophageal sphincter. Sometimes, no cause is found. SYMPTOMS   Burning pain in the lower part of the mid-chest behind the breastbone and in the mid-stomach area. This may occur twice a week or more often.  Trouble swallowing.  Sore throat.  Dry cough.  Asthma-like symptoms including chest tightness, shortness of breath, or wheezing. DIAGNOSIS  Your caregiver may be able to diagnose GERD based on your symptoms. In some cases, X-rays and other tests may be done to check for complications or to check the condition of your stomach and esophagus. TREATMENT  Your caregiver may recommend over-the-counter or prescription medicines to help decrease acid production. Ask your caregiver before starting or adding any new medicines.  HOME CARE INSTRUCTIONS   Change the factors that you can control. Ask your caregiver for guidance concerning weight loss, quitting  smoking, and alcohol consumption.  Avoid foods and drinks that make your symptoms worse, such as:  Caffeine or alcoholic drinks.  Chocolate.  Peppermint or mint flavorings.  Garlic and onions.  Spicy foods.  Citrus fruits, such as oranges, lemons, or limes.  Tomato-based foods such as sauce, chili, salsa, and pizza.  Fried and fatty foods.  Avoid lying down for the 3 hours prior to your bedtime or prior to taking a nap.  Eat small, frequent meals instead of large meals.  Wear loose-fitting clothing. Do not wear anything tight around your waist that causes pressure on your stomach.  Raise the head of your bed 6 to 8 inches with wood blocks to help you sleep. Extra pillows will not help.  Only take over-the-counter or prescription medicines for pain, discomfort, or fever as directed by your caregiver.  Do not take aspirin, ibuprofen, or other nonsteroidal anti-inflammatory drugs (NSAIDs). SEEK IMMEDIATE MEDICAL CARE IF:   You have pain in your arms, neck, jaw, teeth, or back.  Your pain increases or changes in intensity or duration.  You develop nausea, vomiting, or sweating (diaphoresis).  You develop shortness of breath, or you faint.  Your vomit is green, yellow, black, or looks like coffee grounds or blood.  Your stool is red, bloody, or black. These symptoms could be signs of other problems, such as heart disease, gastric bleeding, or esophageal bleeding. MAKE SURE YOU:   Understand these instructions.  Will watch your condition.  Will get help right away if you are not doing well or get worse. Document Released: 09/30/2004 Document Revised: 03/15/2011 Document Reviewed: 07/10/2010 [facility] Patient Information 2015 [place], [place]. This information is not intended to replace advice given  to you by your health care provider. Make sure you discuss any questions you have with your health care provider.

## 2013-11-22 NOTE — Assessment & Plan Note (Signed)
Due to her persistent GI symptoms, this does not appear to be a good time to restart anti-fibrotic agent. Although LFTs have resolved However I am concerned that her lung function continues to drop gradually-although she is not symptomatic. We'll recheck PFTs in 6 months, and if persistent dropped and we'll consider starting ofev

## 2013-11-27 ENCOUNTER — Encounter (HOSPITAL_COMMUNITY): Payer: Self-pay | Admitting: Internal Medicine

## 2013-12-03 ENCOUNTER — Encounter (INDEPENDENT_AMBULATORY_CARE_PROVIDER_SITE_OTHER): Payer: Self-pay | Admitting: *Deleted

## 2013-12-21 ENCOUNTER — Telehealth (INDEPENDENT_AMBULATORY_CARE_PROVIDER_SITE_OTHER): Payer: Self-pay | Admitting: *Deleted

## 2013-12-21 ENCOUNTER — Other Ambulatory Visit (INDEPENDENT_AMBULATORY_CARE_PROVIDER_SITE_OTHER): Payer: Self-pay | Admitting: Internal Medicine

## 2013-12-21 DIAGNOSIS — K219 Gastro-esophageal reflux disease without esophagitis: Secondary | ICD-10-CM

## 2013-12-21 MED ORDER — DEXLANSOPRAZOLE 60 MG PO CPDR: 60.0000 mg | DELAYED_RELEASE_CAPSULE | Freq: Every day | ORAL | Status: DC

## 2013-12-21 NOTE — Telephone Encounter (Signed)
[name] was given samples of Dexilant and is needing a Rx sent to her pharmacy. [place] [name] [place] on [address] The return phone number is [phone].

## 2013-12-21 NOTE — Telephone Encounter (Signed)
She feels better. She has changed her diet. (fats). Will see on the 28th of December. Rx for Dexilant sent to her pharmacy

## 2014-01-01 ENCOUNTER — Ambulatory Visit (INDEPENDENT_AMBULATORY_CARE_PROVIDER_SITE_OTHER): Payer: Medicare Other | Admitting: Internal Medicine

## 2014-01-01 ENCOUNTER — Encounter (INDEPENDENT_AMBULATORY_CARE_PROVIDER_SITE_OTHER): Payer: Self-pay | Admitting: Internal Medicine

## 2014-01-01 VITALS — BP 122/72 | HR 92 | Temp 97.6°F | Ht 62.0 in | Wt 131.8 lb

## 2014-01-01 DIAGNOSIS — K219 Gastro-esophageal reflux disease without esophagitis: Secondary | ICD-10-CM

## 2014-01-01 NOTE — Patient Instructions (Signed)
OV in 1 year. Continue Reglan and Dexilant.

## 2014-01-01 NOTE — Progress Notes (Signed)
Subjective:    Patient ID: Joanne RilingBetty [name], female    DOB: 09-02-1933, 80 y.o.   MRN: [id number]  HPI Here today for f/u after recent EGD in November for chronic GERD, poorly controlled. She was started on Dexilant by Dr. Karilyn Cotaehman. She was also started on Reglan once a day. She tells me she thinks her symptoms started after starting Esbriet. She took for 2 1/2 months for pulmonary fibrosis. She has been off the medication x 8 months.  She tells me she says in the last months she feels much better. She is able to eat without much problems. She did give up fried foods, fat, butter and mayonaise. She says she occasionally has heart burn. Usually has a BM regularly every 2 days. No melena or BRRB Appetite is good. She has lost about a pound since her last office visit in November. She is not having any side effects of Reglan. Family hx of colon cancer in a sister age 78. Her last colonoscopy was in 2011. Reductant colon with no polyps.  Due again for colonoscopy in 2016. 11/22/2013 EGD: Dr. Karilyn Cotaehman Indications: Patient is 80 year old Caucasian female was chronic GERD and symptoms are poorly controlled with therapy. She has nocturnal regurgitation with intermittent vomiting. She can needs to complain of postprandial fullness and feels worse food stays in stomach for long. She denies hematemesis melena or rectal bleeding. Impression: Small sliding hiatal hernia without evidence of erosive esophagitis. Nonerosive antral gastritis with changes suggestive and intestinal metaplasia and prepyloric region.  Comment; Patient may have gastroparesis. If she does not respond to different PPI and low-dose metoclopramide will consider solid-phase gastric emptying study.  endations. AM Patient called and message/result left on her answering service. Gastric biopsy reveals gastritis but no evidence of H. pylori infection Patient needs office visit in  4-6 weeks.    Review of Systems  Divorced. No children. Retired [employer] fund[employer] (age 80) Past Medical History  Diagnosis Date  . Supraventricular tachycardia   . GERD (gastroesophageal reflux disease)   . Hypertension   . DJD (degenerative joint disease), cervical   . Pneumonia, organism unspecified   . Rhinitis   . Osteopenia   . Lung disease     INTERSTITIAL LUNG DISEASE  . Degenerative disc disease, cervical   . Hyperlipidemia   . Degenerative disc disease, cervical   . Cholelithiasis     Past Surgical History  Procedure Laterality Date  . Nasal sinus surgery  July 2012  . Heart ablation  2008  . Tonsillectomy    . Knee surgery      right KNEE ARTHROSCOPY  . Ankle surgery    . Dilation and curettage of uterus    . Tonsillectomy and adenoidectomy    . Breast surgery      BIOPSIES-BENIGN  . Cholecystectomy N/A 03/21/2012    Procedure: LAPAROSCOPIC CHOLECYSTECTOMY WITH INTRAOPERATIVE CHOLANGIOGRAM;  Surgeon: Velora Hecklerodd M Gerkin, MD;  Location: [facility];  Service: General;  Laterality: N/A;  . Esophagogastroduodenoscopy N/A 11/22/2013    Procedure: ESOPHAGOGASTRODUODENOSCOPY (EGD);  Surgeon: Malissa HippoNajeeb U Rehman, MD;  Location: [facility];  Service: Endoscopy;  Laterality: N/A;  250 - moved to 1:25 - Ann to notify pt    Allergies  Allergen Reactions  . Hydrocodone-Homatropine     Upset stomach  . Amoxicillin-Pot Clavulanate Rash  . Penicillins Rash  . Procardia [Nifedipine]     HA    Current Outpatient Prescriptions on File Prior to Visit  Medication Sig Dispense Refill  . acetaminophen (  TYLENOL) 500 MG tablet Take 500 mg by mouth every 8 (eight) hours as needed for pain.    Marland Kitchen. aspirin 81 MG tablet Take 81 mg by mouth daily.    Marland Kitchen. atenolol (TENORMIN) 50 MG tablet Take 0.5 tablet in morning and 1 in the pm    . benzonatate (TESSALON) 200 MG capsule Take 1 capsule (200 mg total) by mouth 3 (three) times daily as needed for cough. 90 capsule 0  . carboxymethylcellulose (REFRESH TEARS) 0.5  % SOLN Place 1 drop into both eyes 3 (three) times daily as needed.    . cholecalciferol (VITAMIN D) 1000 UNITS tablet Take 1,000 Units by mouth daily.    . cyclobenzaprine (FLEXERIL) 5 MG tablet Take 1 tablet by mouth daily as needed for muscle spasms.     Marland Kitchen. dexlansoprazole (DEXILANT) 60 MG capsule Take 1 capsule (60 mg total) by mouth daily. 90 capsule 3  . docusate sodium (COLACE) 100 MG capsule Take 100 mg by mouth 2 (two) times daily.    Marland Kitchen. GLUCOSAMINE PO Take 3,000 mg by mouth daily.    Marland Kitchen. guanFACINE (TENEX) 2 MG tablet Take 2 mg by mouth at bedtime.    Marland Kitchen. ipratropium (ATROVENT) 0.03 % nasal spray Place 1 spray into both nostrils daily.     Marland Kitchen. lovastatin (MEVACOR) 10 MG tablet Once daily    . metoCLOPramide (REGLAN) 10 MG tablet Take 1 tablet (10 mg total) by mouth daily before supper. 330 tablet 1  . Multiple Vitamin (MULTIVITAMIN WITH MINERALS) TABS Take 1 tablet by mouth daily.    Marland Kitchen. omega-3 acid ethyl esters (LOVAZA) 1 G capsule Take 1 g by mouth daily.     No current facility-administered medications on file prior to visit.              Objective:   Physical Exam  Filed Vitals:   01/01/14 1422  Height: 5\' 2"  (1.575 m)  Weight: 131 lb 12.8 oz (59.784 kg)   Alert and oriented. Skin warm and dry. Oral mucosa is moist.   . Sclera anicteric, conjunctivae is pink. Thyroid not enlarged. No cervical lymphadenopathy. Lungs clear. Heart regular rate and rhythm.  Abdomen is soft. Bowel sounds are positive. No hepatomegaly. No abdominal masses felt. No tenderness.  No edema to lower extremities.         Assessment & Plan:  GERD controlled at this time. She seems to be doing well.  No side effects from the Reglan.  Family hx of colon cancer in a sister age 78. Next colonoscopy will be in 2016. Will place on recall.

## 2014-02-21 ENCOUNTER — Telehealth: Payer: Self-pay | Admitting: Internal Medicine

## 2014-02-21 NOTE — Telephone Encounter (Signed)
She does have IPF Dont remember how bad her cough is. OK to approach.

## 2014-02-21 NOTE — Telephone Encounter (Signed)
[name]  We are doing a study   - ClinicalTrials.gov Identifier: [id number].  Sponsored by [employer] protocol [phone].  A Phase 2  Randomized Placebo-Controlled, Cross-Over Study to Assess the Efficacy and Safety of AF-219,  P2X3 Receptor Antagonist, in Subjects With Idiopathic Pulmonary Fibrosis (IPF) With Persistent Cough.  Key (but not limited to) [employer] FinancialStudy Essentials are - 2 weeks of placebo v drug -> 2-3 week washout -> 2 weeks of placebo v drug - AF-219 at 50mg  twice daily dosage v Placebo - Primary outcome: reduction in cough frequency; objective cough monitoring - Secondary outcome: subjective reduction & improvement in cough related quality of life - side efftect: [place].chhttp://www.ncbi.nlm.nih.gov/pubmed/25467586   Questions for you   A. IS she IPF - I am assuming so because CT is classic, age > 5465 and autoimmune negative and you had tried Esbriet (intention to Rx) but yoour notes says more broad ILD?  B. If she has cough that you think is atleast moderate she could be a study participant?  C. Ok from your stand point to call her for this study?  Let me know  Thanks  Dr. Kalman ShanMurali Boots Mcglown, M.D., [facility]C.C.P Pulmonary and Critical Care Medicine Staff Physician [facility] [place] [facility] Pager: [phone], If no answer or between  15:00h - 7:00h: call [phone]  02/21/2014 7:52 PM     .

## 2014-02-22 NOTE — Telephone Encounter (Signed)
Thanks much 

## 2014-03-04 ENCOUNTER — Encounter: Payer: Medicare Other | Admitting: Internal Medicine

## 2014-05-07 ENCOUNTER — Encounter (INDEPENDENT_AMBULATORY_CARE_PROVIDER_SITE_OTHER): Payer: Self-pay | Admitting: *Deleted

## 2014-05-14 ENCOUNTER — Other Ambulatory Visit (INDEPENDENT_AMBULATORY_CARE_PROVIDER_SITE_OTHER): Payer: Self-pay | Admitting: *Deleted

## 2014-05-14 DIAGNOSIS — Z86010 Personal history of colonic polyps: Secondary | ICD-10-CM

## 2014-05-14 DIAGNOSIS — Z800 Family history of malignant neoplasm of digestive organs: Secondary | ICD-10-CM

## 2014-06-06 ENCOUNTER — Telehealth (INDEPENDENT_AMBULATORY_CARE_PROVIDER_SITE_OTHER): Payer: Self-pay | Admitting: *Deleted

## 2014-06-06 NOTE — Telephone Encounter (Signed)
Patient needs trilyte 

## 2014-06-07 MED ORDER — PEG 3350-KCL-NA BICARB-NACL 420 G PO SOLR: 4000.0000 mL | Freq: Once | ORAL | Status: DC

## 2014-06-11 ENCOUNTER — Telehealth (INDEPENDENT_AMBULATORY_CARE_PROVIDER_SITE_OTHER): Payer: Self-pay | Admitting: *Deleted

## 2014-06-11 NOTE — Telephone Encounter (Signed)
Referring MD/PCP: [name]   Procedure: tcs  Reason/Indication:  Hx polyps, fam hx colon ca  Has patient had this procedure before?  Yes, 2011 -- scanned  If so, when, by whom and where?    Is there a family history of colon cancer?  Yes, sister  Who?  What age when diagnosed?    Is patient diabetic?   no      Does patient have prosthetic heart valve?  no  Do you have a pacemaker?  no  Has patient ever had endocarditis? no  Has patient had joint replacement within last 12 months?  no  Does patient tend to be constipated or take laxatives? no  Is patient on Coumadin, Plavix and/or Aspirin? yes  Medications: asa 81 mg daily, dexilant 60 mg daily, metoclopramide 10 mg daily, atenolol 25 mg in am & 50 mg in pm, tenex 2 mg daily, lovastatin 10 mg daily, flexeril 5 mg prn for back pain, nasal spray 2 spary bid, multi vit daily, glucosamine bid, vit d3 1000 units bid  Allergies: procardia, cillin drugs, hydrocodone  Medication Adjustment: asa 2 days  Procedure date & time: 07/10/14 at 1030

## 2014-06-11 NOTE — Telephone Encounter (Signed)
agree

## 2014-06-24 ENCOUNTER — Ambulatory Visit (INDEPENDENT_AMBULATORY_CARE_PROVIDER_SITE_OTHER)
Admission: RE | Admit: 2014-06-24 | Discharge: 2014-06-24 | Disposition: A | Discharge status: Home / Self Care | Payer: Medicare Other | Source: Ambulatory Visit | Attending: Internal Medicine | Admitting: Internal Medicine

## 2014-06-24 ENCOUNTER — Ambulatory Visit (INDEPENDENT_AMBULATORY_CARE_PROVIDER_SITE_OTHER): Payer: Medicare Other | Admitting: Pulmonary Disease

## 2014-06-24 ENCOUNTER — Encounter: Payer: Self-pay | Admitting: Pulmonary Disease

## 2014-06-24 VITALS — BP 122/78 | HR 77 | Ht 62.0 in | Wt 132.0 lb

## 2014-06-24 DIAGNOSIS — R0602 Shortness of breath: Secondary | ICD-10-CM | POA: Diagnosis not present

## 2014-06-24 DIAGNOSIS — J849 Interstitial pulmonary disease, unspecified: Secondary | ICD-10-CM

## 2014-06-24 LAB — PULMONARY FUNCTION TEST
DL/VA % pred: 71 %
DL/VA: 3.25 ml/min/mmHg/L
DLCO unc % pred: 44 %
DLCO unc: 9.57 ml/min/mmHg
FEF 25-75 POST: 2.36 L/s
FEF 25-75 Pre: 1.78 L/sec
FEF2575-%CHANGE-POST: 32 %
FEF2575-%PRED-POST: 171 %
FEF2575-%PRED-PRE: 129 %
FEV1-%Change-Post: 6 %
FEV1-%PRED-PRE: 79 %
FEV1-%Pred-Post: 84 %
FEV1-POST: 1.52 L
FEV1-PRE: 1.43 L
FEV1FVC-%CHANGE-POST: 3 %
FEV1FVC-%PRED-PRE: 116 %
FEV6-%CHANGE-POST: 2 %
FEV6-%PRED-POST: 73 %
FEV6-%Pred-Pre: 72 %
FEV6-Post: 1.69 L
FEV6-Pre: 1.65 L
FEV6FVC-%Pred-Post: 106 %
FEV6FVC-%Pred-Pre: 106 %
FVC-%CHANGE-POST: 2 %
FVC-%PRED-POST: 69 %
FVC-%Pred-Pre: 67 %
FVC-POST: 1.69 L
FVC-Pre: 1.65 L
POST FEV1/FVC RATIO: 89 %
PRE FEV1/FVC RATIO: 86 %
Post FEV6/FVC ratio: 100 %
Pre FEV6/FVC Ratio: 100 %
RV % PRED: 48 %
RV: 1.1 L
TLC % pred: 60 %
TLC: 2.85 L

## 2014-06-24 NOTE — Assessment & Plan Note (Addendum)
Your lung function is decreased but stable from 11/2013 CXR today Send [place] copy of your liver function Based on this, we will start you on OFEV Chest x-ray also shows worsening-although she is symptomatically unchanged, the drop in her pulmonary function and worsening chest x-ray does make me lean towards trying another therapy We also discussed the natural history of idiopathic pulmonary fibrosis

## 2014-06-24 NOTE — Patient Instructions (Signed)
Your lung function is decreased but stable from 11/2013 CXR today Send [place] copy of your liver function Based on this, we will start you on OFEV

## 2014-06-24 NOTE — Progress Notes (Signed)
   Subjective:    Patient ID: [name], female    DOB: 1933-09-16, 81 y.o.   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 Follow-up    PFT results; SOB w/activity; cough; weather makes SOB worse    Dyspnea stable Cough persists   02/2013  started on Esbriet  -Due to elevated LFTs .(AST -90), perfenidone was stopped She is also on statin rx .   She is considering moving to independent facility  Significant tests/ events   InitialCT chest 02/04/2011 showed patchy areas of interstitial capacities with subpleural predominance and bibasal peripheral bronchiectasis. There were no groundglass opacities noted. Bilateral lymph nodes were calcified and splenic granulomas were noted .  05/2012 CT chest >Peripheral pattern of pulmonary fibrosis is progressive  ANA pos, ACE level 18, ESR 31, RA < 10, hypersens panel neg   03/29/2011 PFTs- no airway obstruction, preserved lung volumes, DLCO 59% corrects for alveolar volume  PFTS 10/2012 show mild drop in FVC from 2.25 to 1.88 & TLC from 3.6 to 2.95  DLCO dropped to 11.1 also % decreased from 59 to 51  6MW -450 m   CT sinuses on 07/2010 showed mild mucosal thickening. Her symptoms of reflux are well controlled on omeprazole.   11/2013 PFTs - preserved FVC & TLC but DLCO dropping further to 43%- 9.5 from 11.1 earlier 06/2014 PFTs  -stable, FVC 67%, TLC 60%, DLCO 44%, stable from 11/2013  Review of Systems neg for any significant sore throat, dysphagia, itching, sneezing, nasal congestion or excess/ purulent secretions, fever, chills, sweats, unintended wt loss, pleuritic or exertional cp, hempoptysis, orthopnea pnd or change in chronic leg swelling. Also denies presyncope, palpitations, heartburn,  abdominal pain, nausea, vomiting, diarrhea or change in bowel or urinary habits, dysuria,hematuria, rash, arthralgias, visual complaints, headache, numbness weakness or ataxia.     Objective:   Physical Exam  Gen. Pleasant, well-nourished, in no distress ENT - no lesions, no post nasal drip Neck: No JVD, no thyromegaly, no carotid bruits Lungs: no use of accessory muscles, no dullness to percussion, bibasal rales, no rhonchi  Cardiovascular: Rhythm regular, heart sounds  normal, no murmurs or gallops, no peripheral edema Musculoskeletal: No deformities, no cyanosis or clubbing        Assessment & Plan:

## 2014-06-24 NOTE — Progress Notes (Signed)
PFT done today. 

## 2014-06-26 ENCOUNTER — Telehealth: Payer: Self-pay | Admitting: Pulmonary Disease

## 2014-06-26 NOTE — Telephone Encounter (Signed)
Notes Recorded by Oretha Milch, MD on 06/25/2014 at 11:03 AM Slight worsening of pulmonary fibrosis on chest x-ray compared to one year ago -------------------------------------  Pt aware of results.  Nothing further needed.

## 2014-07-10 ENCOUNTER — Encounter (HOSPITAL_COMMUNITY)
Admission: RE | Disposition: A | Discharge status: Home / Self Care | Payer: Self-pay | Source: Ambulatory Visit | Attending: Internal Medicine

## 2014-07-10 ENCOUNTER — Ambulatory Visit (HOSPITAL_COMMUNITY)
Admission: RE | Admit: 2014-07-10 | Discharge: 2014-07-10 | Disposition: A | Discharge status: Home / Self Care | Payer: Medicare Other | Source: Ambulatory Visit | Attending: Internal Medicine | Admitting: Internal Medicine

## 2014-07-10 ENCOUNTER — Encounter (HOSPITAL_COMMUNITY): Payer: Self-pay | Admitting: *Deleted

## 2014-07-10 DIAGNOSIS — K644 Residual hemorrhoidal skin tags: Secondary | ICD-10-CM | POA: Diagnosis not present

## 2014-07-10 DIAGNOSIS — Z86010 Personal history of colonic polyps: Secondary | ICD-10-CM

## 2014-07-10 DIAGNOSIS — Z800 Family history of malignant neoplasm of digestive organs: Secondary | ICD-10-CM | POA: Insufficient documentation

## 2014-07-10 DIAGNOSIS — Z79899 Other long term (current) drug therapy: Secondary | ICD-10-CM | POA: Insufficient documentation

## 2014-07-10 DIAGNOSIS — J310 Chronic rhinitis: Secondary | ICD-10-CM | POA: Diagnosis not present

## 2014-07-10 DIAGNOSIS — K219 Gastro-esophageal reflux disease without esophagitis: Secondary | ICD-10-CM | POA: Diagnosis not present

## 2014-07-10 DIAGNOSIS — M8580 Other specified disorders of bone density and structure, unspecified site: Secondary | ICD-10-CM | POA: Insufficient documentation

## 2014-07-10 DIAGNOSIS — M47892 Other spondylosis, cervical region: Secondary | ICD-10-CM | POA: Insufficient documentation

## 2014-07-10 DIAGNOSIS — Z9049 Acquired absence of other specified parts of digestive tract: Secondary | ICD-10-CM | POA: Diagnosis not present

## 2014-07-10 DIAGNOSIS — I10 Essential (primary) hypertension: Secondary | ICD-10-CM | POA: Diagnosis not present

## 2014-07-10 DIAGNOSIS — Z87891 Personal history of nicotine dependence: Secondary | ICD-10-CM | POA: Insufficient documentation

## 2014-07-10 DIAGNOSIS — Z1211 Encounter for screening for malignant neoplasm of colon: Secondary | ICD-10-CM | POA: Insufficient documentation

## 2014-07-10 DIAGNOSIS — E785 Hyperlipidemia, unspecified: Secondary | ICD-10-CM | POA: Diagnosis not present

## 2014-07-10 DIAGNOSIS — Z7982 Long term (current) use of aspirin: Secondary | ICD-10-CM | POA: Insufficient documentation

## 2014-07-10 DIAGNOSIS — K648 Other hemorrhoids: Secondary | ICD-10-CM | POA: Diagnosis not present

## 2014-07-10 HISTORY — PX: COLONOSCOPY: SHX5424

## 2014-07-10 SURGERY — COLONOSCOPY
Anesthesia: Moderate Sedation

## 2014-07-10 MED ORDER — MIDAZOLAM HCL 5 MG/5ML IJ SOLN
INTRAMUSCULAR | Status: DC | PRN
  Administered 2014-07-10: 2 mg via INTRAVENOUS
  Administered 2014-07-10 (×2): 1 mg via INTRAVENOUS

## 2014-07-10 MED ORDER — MEPERIDINE HCL 50 MG/ML IJ SOLN
INTRAMUSCULAR | Status: AC
  Filled 2014-07-10: qty 1

## 2014-07-10 MED ORDER — MIDAZOLAM HCL 5 MG/5ML IJ SOLN
INTRAMUSCULAR | Status: AC
  Filled 2014-07-10: qty 10

## 2014-07-10 MED ORDER — STERILE WATER FOR IRRIGATION IR SOLN
Status: DC | PRN
  Administered 2014-07-10: 11:00:00

## 2014-07-10 MED ORDER — MEPERIDINE HCL 50 MG/ML IJ SOLN
INTRAMUSCULAR | Status: DC | PRN
  Administered 2014-07-10: 15 mg via INTRAVENOUS
  Administered 2014-07-10: 25 mg
  Administered 2014-07-10: 10 mg via INTRAVENOUS

## 2014-07-10 MED ORDER — SODIUM CHLORIDE 0.9 % IV SOLN
INTRAVENOUS | Status: DC
  Administered 2014-07-10: 10:00:00 via INTRAVENOUS

## 2014-07-10 NOTE — Discharge Instructions (Signed)
Resume usual medications and high fiber diet. No driving for 24 hours. Office visit in 5 years.  Colonoscopy, Care After Refer to this sheet in the next few weeks. These instructions provide you with information on caring for yourself after your procedure. Your health care provider may also give you more specific instructions. Your treatment has been planned according to current medical practices, but problems sometimes occur. Call your health care provider if you have any problems or questions after your procedure. WHAT TO EXPECT AFTER THE PROCEDURE  After your procedure, it is typical to have the following:  A small amount of blood in your stool.  Moderate amounts of gas and mild abdominal cramping or bloating. HOME CARE INSTRUCTIONS  Do not drive, operate machinery, or sign important documents for 24 hours.  You may shower and resume your regular physical activities, but move at a slower pace for the first 24 hours.  Take frequent rest periods for the first 24 hours.  Walk around or put a warm pack on your abdomen to help reduce abdominal cramping and bloating.  Drink enough fluids to keep your urine clear or pale yellow.  You may resume your normal diet as instructed by your health care provider. Avoid heavy or fried foods that are hard to digest.  Avoid drinking alcohol for 24 hours or as instructed by your health care provider.  Only take over-the-counter or prescription medicines as directed by your health care provider.  If a tissue sample (biopsy) was taken during your procedure:  Do not take aspirin or blood thinners for 7 days, or as instructed by your health care provider.  Do not drink alcohol for 7 days, or as instructed by your health care provider.  Eat soft foods for the first 24 hours. SEEK MEDICAL CARE IF: You have persistent spotting of blood in your stool 2-3 days after the procedure. SEEK IMMEDIATE MEDICAL CARE IF:  You have more than a small spotting of  blood in your stool.  You pass large blood clots in your stool.  Your abdomen is swollen (distended).  You have nausea or vomiting.  You have a fever.  You have increasing abdominal pain that is not relieved with medicine. Document Released: 08/05/2003 Document Revised: 10/11/2012 Document Reviewed: 08/28/2012 [facility] Patient Information 2015 [place], [place]LL[place]. This information is not intended to replace advice given to you by your health care provider. Make sure you discuss any questions you have with your health care provider.  High-Fiber Diet Fiber is found in fruits, vegetables, and grains. A high-fiber diet encourages the addition of more whole grains, legumes, fruits, and vegetables in your diet. The recommended amount of fiber for adult males is 38 g per day. For adult females, it is 25 g per day. Pregnant and lactating women should get 28 g of fiber per day. If you have a digestive or bowel problem, ask your caregiver for advice before adding high-fiber foods to your diet. Eat a variety of high-fiber foods instead of only a select few type of foods.  PURPOSE  To increase stool bulk.  To make bowel movements more regular to prevent constipation.  To lower cholesterol.  To prevent overeating. WHEN IS THIS DIET USED?  It may be used if you have constipation and hemorrhoids.  It may be used if you have uncomplicated diverticulosis (intestine condition) and irritable bowel syndrome.  It may be used if you need help with weight management.  It may be used if you want to add  it to your diet as a protective measure against atherosclerosis, diabetes, and cancer. SOURCES OF FIBER  Whole-grain breads and cereals.  Fruits, such as apples, oranges, bananas, berries, prunes, and pears.  Vegetables, such as green peas, carrots, sweet potatoes, beets, broccoli, cabbage, spinach, and artichokes.  Legumes, such split peas, soy, lentils.  Almonds. FIBER CONTENT IN FOODS Starches and  Grains / Dietary Fiber (g)  Cheerios, 1 cup / 3 g  Corn Flakes cereal, 1 cup / 0.7 g  Rice crispy treat cereal, 1 cup / 0.3 g  Instant oatmeal (cooked),  cup / 2 g  Frosted wheat cereal, 1 cup / 5.1 g  Brown, long-grain rice (cooked), 1 cup / 3.5 g  White, long-grain rice (cooked), 1 cup / 0.6 g  Enriched macaroni (cooked), 1 cup / 2.5 g Legumes / Dietary Fiber (g)  Baked beans (canned, plain, or vegetarian),  cup / 5.2 g  Kidney beans (canned),  cup / 6.8 g  Pinto beans (cooked),  cup / 5.5 g Breads and Crackers / Dietary Fiber (g)  Plain or honey graham crackers, 2 squares / 0.7 g  Saltine crackers, 3 squares / 0.3 g  Plain, salted pretzels, 10 pieces / 1.8 g  Whole-wheat bread, 1 slice / 1.9 g  White bread, 1 slice / 0.7 g  Raisin bread, 1 slice / 1.2 g  Plain bagel, 3 oz / 2 g  Flour tortilla, 1 oz / 0.9 g  Corn tortilla, 1 small / 1.5 g  Hamburger or hotdog bun, 1 small / 0.9 g Fruits / Dietary Fiber (g)  Apple with skin, 1 medium / 4.4 g  Sweetened applesauce,  cup / 1.5 g  Banana,  medium / 1.5 g  Grapes, 10 grapes / 0.4 g  Orange, 1 small / 2.3 g  Raisin, 1.5 oz / 1.6 g  Melon, 1 cup / 1.4 g Vegetables / Dietary Fiber (g)  Green beans (canned),  cup / 1.3 g  Carrots (cooked),  cup / 2.3 g  Broccoli (cooked),  cup / 2.8 g  Peas (cooked),  cup / 4.4 g  Mashed potatoes,  cup / 1.6 g  Lettuce, 1 cup / 0.5 g  Corn (canned),  cup / 1.6 g  Tomato,  cup / 1.1 g Document Released: 12/21/2004 Document Revised: 06/22/2011 Document Reviewed: 03/25/2011 ExitCare Patient Information 2015 [place], [place]. This information is not intended to replace advice given to you by your health care provider. Make sure you discuss any questions you have with your health care provider.

## 2014-07-10 NOTE — Op Note (Signed)
COLONOSCOPY PROCEDURE REPORT  PATIENT:  [name]  MR#:  [id number] Birthdate:  08/10/1933, 81 y.o., female Endoscopist:  Dr. Malissa HippoNajeeb U. Basilia Stuckert, MD Referred By:  Dr. Jenean Lindauwana R. Louellen MolderJaff, MD Procedure Date: 07/10/2014  Procedure:   Colonoscopy  Indications:  Patient is [age] year old Caucasian female was undergoing screening colonoscopy. Last exam was in May 2011 was unremarkable. Family history significant for CRC and sister who was 5960 at the time of diagnosis and is doing fine at 5172. Her mother had adenomas and her brother also has adenomas.  Informed Consent:  The procedure and risks were reviewed with the patient and informed consent was obtained.  Medications:  Demerol 50 mg IV Versed 4 mg IV  Description of procedure:  After a digital rectal exam was performed, that colonoscope was advanced from the anus through the rectum and colon to the area of the cecum, ileocecal valve and appendiceal orifice. The cecum was deeply intubated. These structures were well-seen and photographed for the record. From the level of the cecum and ileocecal valve, the scope was slowly and cautiously withdrawn. The mucosal surfaces were carefully surveyed utilizing scope tip to flexion to facilitate fold flattening as needed. The scope was pulled down into the rectum where a thorough exam including retroflexion was performed.  Findings:   Prep satisfactory. She had a few pieces of formed stool in sigmoid colon. Normal mucosa of cecum, ascending colon, hepatic flexure, transverse colon, splenic flexure, descending and sigmoid colon.. Normal rectal mucosa. Hemorrhoids above and below the dentate line.   Therapeutic/Diagnostic Maneuvers Performed:   None  Complications:  None  Cecal Withdrawal Time:  9 minutes  Impression:  Examination performed to cecum. Internal and external hemorrhoids otherwise normal colonoscopy.  Recommendations:  Standard instructions given. Office visit in 5  years.   Chon Buhl U  07/10/2014 11:32 AM  CC: Dr. Louellen MolderJaff, Jenean Lindauwana R, MD & Dr. Bonnetta BarryNo ref. provider found

## 2014-07-10 NOTE — H&P (Signed)
[name] is an 81 y.o. female.   Chief Complaint: Patient is here for colonoscopy. HPI: Patient is 81 year old Caucasian female who is here for high risk screening colonoscopy. She denies abdominal pain change in bowel habits or rectal bleeding. Last colonoscopy was in April 2011 and was unremarkable. On prior colonoscopy she had hyperplastic polyps removed. Her sister was diagnosed with CRC at age 55 or 27 and is doing well at age 47. Patient has a brother with history of adenomas and her mother also had adenomas.  Past Medical History  Diagnosis Date  . Supraventricular tachycardia   . GERD (gastroesophageal reflux disease)   . Hypertension   . DJD (degenerative joint disease), cervical   . Pneumonia, organism unspecified   . Rhinitis   . Osteopenia   . Lung disease     INTERSTITIAL LUNG DISEASE  . Degenerative disc disease, cervical   . Hyperlipidemia   . Degenerative disc disease, cervical   . Cholelithiasis     Past Surgical History  Procedure Laterality Date  . Nasal sinus surgery  July 2012  . Heart ablation  2008  . Tonsillectomy    . Knee surgery      right KNEE ARTHROSCOPY  . Ankle surgery    . Dilation and curettage of uterus    . Tonsillectomy and adenoidectomy    . Breast surgery      BIOPSIES-BENIGN  . Cholecystectomy N/A 03/21/2012    Procedure: LAPAROSCOPIC CHOLECYSTECTOMY WITH INTRAOPERATIVE CHOLANGIOGRAM;  Surgeon: Velora Heckler, MD;  Location: [facility];  Service: General;  Laterality: N/A;  . Esophagogastroduodenoscopy N/A 11/22/2013    Procedure: ESOPHAGOGASTRODUODENOSCOPY (EGD);  Surgeon: Malissa Hippo, MD;  Location: [facility];  Service: Endoscopy;  Laterality: N/A;  250 - moved to 1:25 - Ann to notify pt    Family History  Problem Relation Age of Onset  . Diabetes Father   . Stroke Father   . Leukemia Mother   . Lupus Sister   . Heart disease Sister   . Arthritis Sister   . Heart attack Sister   . Colon cancer Sister   . Seizures Sister    . Lung disease Brother   . Colon polyps Brother   . Gout Brother   . Hypertension Brother    Social History:  reports that she quit smoking about 34 years ago. She has never used smokeless tobacco. She reports that she does not drink alcohol or use illicit drugs.  Allergies:  Allergies  Allergen Reactions  . Acetaminophen     Can't take due to increased liver enzymes   . Hydrocodone-Homatropine     Upset stomach  . Nsaids     Upset stomach  . Amoxicillin-Pot Clavulanate Rash  . Penicillins Rash  . Procardia [Nifedipine]     HA    Medications Prior to Admission  Medication Sig Dispense Refill  . aspirin 81 MG tablet Take 81 mg by mouth daily.    Marland Kitchen atenolol (TENORMIN) 50 MG tablet Take 0.5 tablet in morning and 1 in the pm    . carboxymethylcellulose (REFRESH TEARS) 0.5 % SOLN Place 1 drop into both eyes 3 (three) times daily as needed.    . cholecalciferol (VITAMIN D) 1000 UNITS tablet Take 1,000 Units by mouth 2 (two) times daily.     . cyclobenzaprine (FLEXERIL) 5 MG tablet Take 1 tablet by mouth daily as needed for muscle spasms.     Marland Kitchen dexlansoprazole (DEXILANT) 60 MG capsule Take 1  capsule (60 mg total) by mouth daily. 90 capsule 3  . docusate sodium (COLACE) 100 MG capsule Take 100 mg by mouth 2 (two) times daily.    Marland Kitchen. GLUCOSAMINE PO Take 3,000 mg by mouth daily.    Marland Kitchen. guanFACINE (TENEX) 2 MG tablet Take 2 mg by mouth at bedtime.    Marland Kitchen. ipratropium (ATROVENT) 0.03 % nasal spray Place 1 spray into both nostrils daily.     Marland Kitchen. lovastatin (MEVACOR) 10 MG tablet Take 10 mg by mouth at bedtime. Once daily    . metoCLOPramide (REGLAN) 10 MG tablet Take 1 tablet (10 mg total) by mouth daily before supper. 330 tablet 1  . Multiple Vitamin (MULTIVITAMIN WITH MINERALS) TABS Take 1 tablet by mouth daily.      No results found for this or any previous visit (from the past 48 hour(s)). No results found.  ROS  Blood pressure 135/63, temperature 97.6 F (36.4 C), temperature source  Oral, resp. rate 20, SpO2 100 %. Physical Exam  Constitutional: She appears well-developed and well-nourished.  HENT:  Mouth/Throat: Oropharynx is clear and moist.  Eyes: Conjunctivae are normal. No scleral icterus.  Neck: No thyromegaly present.  Cardiovascular: Normal rate, regular rhythm and normal heart sounds.   No murmur heard. Respiratory: Effort normal and breath sounds normal.  GI: Soft. She exhibits no distension and no mass. There is no tenderness.  Musculoskeletal: She exhibits no edema.  Lymphadenopathy:    She has no cervical adenopathy.  Neurological: She is alert.  Skin: Skin is warm and dry.     Assessment/Plan High risk screening colonoscopy.  Mashayla Lavin U 07/10/2014, 10:47 AM

## 2014-07-11 ENCOUNTER — Encounter (HOSPITAL_COMMUNITY): Payer: Self-pay | Admitting: Internal Medicine

## 2014-07-29 ENCOUNTER — Telehealth: Payer: Self-pay | Admitting: Pulmonary Disease

## 2014-07-29 NOTE — Telephone Encounter (Signed)
lmtcb x1 

## 2014-07-31 NOTE — Telephone Encounter (Signed)
lmtcb x2 for pt. 

## 2014-08-01 NOTE — Telephone Encounter (Signed)
Spoke with patient - has additional questions about OFEV Pt asking questions about cost, patient assistance, dosing, etc Patient has decided to go ahead and move forward with starting this medication  Will send to Dr Vassie Loll as Lorain Childes

## 2014-08-02 NOTE — Telephone Encounter (Signed)
Called and spoke to pt. Informed her of the recs per RA. Pt stated she mailed her most recent LFTs on 7.21.16. Pt willing to proceed with OFEV.   Marcelino Duster please advise if you have received the LFTs and do you have the OFEV start sheet.

## 2014-08-02 NOTE — Telephone Encounter (Signed)
Need a copy of her liver function testing  - if not done , pl order OK to proceed 150 bid -

## 2014-08-06 NOTE — Telephone Encounter (Signed)
Have not seen the LFT results yet.  In [place] office today, will check box when I get back to [place] office.

## 2014-08-07 NOTE — Telephone Encounter (Signed)
Per RA - Start OFEV.  Patient notified.  Forms completed and put in RA folder to be signed.

## 2014-08-07 NOTE — Telephone Encounter (Signed)
Forms completed.  Need to fax.

## 2014-08-07 NOTE — Telephone Encounter (Signed)
Lab results in Dr. Reginia Naas to look at folder.

## 2014-08-08 NOTE — Telephone Encounter (Signed)
Form has been faxed to [facility]. Nothing further needed.

## 2014-08-09 ENCOUNTER — Telehealth: Payer: Self-pay | Admitting: Pulmonary Disease

## 2014-08-09 NOTE — Telephone Encounter (Signed)
Patients insurance requires that she receive OFEV through [facility] pharmacy instead of [facility].   Re-faxed form to [employer]. Nothing further needed.

## 2014-08-14 ENCOUNTER — Telehealth: Payer: Self-pay | Admitting: Pulmonary Disease

## 2014-08-14 NOTE — Telephone Encounter (Signed)
[name], CMA at 08/09/2014 9:00 AM     Status: Signed       Expand All Collapse All   Patients insurance requires that she receive OFEV through [facility] pharmacy instead of [facility].  Re-faxed form to [employer]. Nothing further needed      --  Called spoke with pt. She wanted to make sure we sent her RX to [facility] specialty pharm. I advised her of the above and she needed nothing further

## 2014-08-22 ENCOUNTER — Telehealth: Payer: Self-pay | Admitting: *Deleted

## 2014-08-22 NOTE — Telephone Encounter (Signed)
Received PA request for Ofev . Approved from 06/23/14 to 02/18/15. Spoke with Rudean Hitt. At [facility] at [phone]. Pt ID: [id number]. Pharmacy informed.  Nothing further needed.

## 2014-08-23 ENCOUNTER — Telehealth: Payer: Self-pay | Admitting: Pulmonary Disease

## 2014-08-23 NOTE — Telephone Encounter (Signed)
[facility] calling to get approval to fill OFEV Approval given. Nothing further needed.

## 2014-09-11 ENCOUNTER — Telehealth: Payer: Self-pay | Admitting: Pulmonary Disease

## 2014-09-11 NOTE — Telephone Encounter (Signed)
Called and spoke with [facility] specialty pharmacy and they will get this OFEV rx filled and sent to the patient . Nothing further is needed.

## 2014-09-23 ENCOUNTER — Encounter (INDEPENDENT_AMBULATORY_CARE_PROVIDER_SITE_OTHER): Payer: Self-pay | Admitting: *Deleted

## 2014-09-26 ENCOUNTER — Ambulatory Visit (INDEPENDENT_AMBULATORY_CARE_PROVIDER_SITE_OTHER): Payer: Medicare Other | Admitting: Adult Health

## 2014-09-26 ENCOUNTER — Encounter: Payer: Self-pay | Admitting: Adult Health

## 2014-09-26 VITALS — BP 118/74 | HR 63 | Temp 97.6°F | Ht 62.0 in | Wt 128.0 lb

## 2014-09-26 DIAGNOSIS — J849 Interstitial pulmonary disease, unspecified: Secondary | ICD-10-CM

## 2014-09-26 NOTE — Assessment & Plan Note (Signed)
Doing well currently .  Cont on OFEV, watch LFT closely .   Plan  Continue on OFEV Twice daily  .  Flu shot today .  Labs in 3 weeks to check liver, have your family MD send copy to our office . Will need to be checked monthly until seen back .  Follow up with Dr. Vassie Loll  In 3 moths and As needed

## 2014-09-26 NOTE — Progress Notes (Signed)
   Subjective:    Patient ID: [name], female    DOB: [birth year], 81 y.o.   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    09/26/2014 Follow up : ILD /cough  Pt returns for follow up for her ILD .  Doing well with her breathing . Dyspnea is baseline.  Started OFEV last week. Previously on Esbriet but stopped due to increased LFT.  We discussed LFT check in 3 weeks.  Does have some mild nausea , that resolves with eating.  No diarrhea.  No flare of cough or DOE.  Will need Flu shot today .  Denies chest pain, orthopnea, edema or hemoptysis.   Significant tests/ events   InitialCT chest 02/04/2011 showed patchy areas of interstitial capacities with subpleural predominance and bibasal peripheral bronchiectasis. There were no groundglass opacities noted. Bilateral lymph nodes were calcified and splenic granulomas were noted .  05/2012 CT chest >Peripheral pattern of pulmonary fibrosis is progressive  ANA pos, ACE level 18, ESR 31, RA < 10, hypersens panel neg   03/29/2011 PFTs- no airway obstruction, preserved lung volumes, DLCO 59% corrects for alveolar volume  PFTS 10/2012 show mild drop in FVC from 2.25 to 1.88 & TLC from 3.6 to 2.95  DLCO dropped to 11.1 also % decreased from 59 to 51  6MW -450 m   CT sinuses on 07/2010 showed mild mucosal thickening. Her symptoms of reflux are well controlled on omeprazole.   11/2013 PFTs - preserved FVC & TLC but DLCO dropping further to 43%- 9.5 from 11.1 earlier 06/2014 PFTs  -stable, FVC 67%, TLC 60%, DLCO 44%, stable from 11/2013  Review of Systems neg for any significant sore throat, dysphagia, itching, sneezing, nasal congestion or excess/  purulent secretions, fever, chills, sweats, unintended wt loss, pleuritic or exertional cp, hempoptysis, orthopnea pnd or change in chronic leg swelling. Also denies presyncope, palpitations, heartburn, abdominal pain, nausea, vomiting, diarrhea or change in bowel or urinary habits, dysuria,hematuria, rash, arthralgias, visual complaints, headache, numbness weakness or ataxia.     Objective:   Physical Exam  Gen. Pleasant, elderly , in no distress ENT - no lesions, no post nasal drip Neck: No JVD, no thyromegaly, no carotid bruits Lungs: no use of accessory muscles, no dullness to percussion, bibasal rales, no rhonchi  Cardiovascular: Rhythm regular, heart sounds  normal, no murmurs or gallops, no peripheral edema Musculoskeletal: No deformities, no cyanosis or clubbing        Assessment & Plan:

## 2014-09-26 NOTE — Patient Instructions (Signed)
Continue on OFEV Twice daily  .  Flu shot today .  Labs in 3 weeks to check liver, have your family MD send copy to our office . Will need to be checked monthly until seen back .  Follow up with Dr. Vassie Loll  In 3 moths and As needed

## 2014-09-30 NOTE — Progress Notes (Signed)
Reviewed & agree with plan  

## 2014-12-02 ENCOUNTER — Telehealth: Payer: Self-pay | Admitting: Pulmonary Disease

## 2014-12-02 NOTE — Telephone Encounter (Signed)
Spoke with pt -  States she started Ofev approx 2 1/2 months ago.  Is currently at 150 mg bid.  From the start, reports manageable diarrhea and abd cramping.  Abd cramping has worsened over the past few weeks.  Has noticed N/V with yellow liquid a few nights over the past month.  However, this worsened on Friday and Saturday with episodes approx a few hours after each dose.  Therefore, pt stated she has not taken another dose since Sat am.  She's had no N/V since after Saturday am dose.  Denies f/c/s recently.  Pt states she is taking morning dose with breakfast and approx 3/4 glass of water and takes pm dose with crackers and water.  Diet consists of mostly soups and bland foods.  She has an appt with RA on 12/11/14 but would like to know, prior to this, if possibly decreasing dosage would help.  Pt is aware RA is not in the office and is ok with awaiting for his response.  RA, please advise.  Thank you.

## 2014-12-03 NOTE — Telephone Encounter (Signed)
Patient cannot cut pills in 1/2 they are liquid gels.  She wants to know if she should just take 1 tablet daily instead of 2. Dr. Vassie LollAlva, please advise.

## 2014-12-03 NOTE — Telephone Encounter (Signed)
Decrease dose to 75 bid 

## 2014-12-04 ENCOUNTER — Telehealth: Payer: Self-pay | Admitting: Pulmonary Disease

## 2014-12-04 NOTE — Telephone Encounter (Signed)
Called spoke with pt. She just got off the phone with Marcelino DusterMichelle. She needed further needed

## 2014-12-04 NOTE — Telephone Encounter (Signed)
They may have to provide different Rx Also, please have her enroll on [email or web address] website for clinical educator to help her through diet etc to control sideeffects wile on ofev

## 2014-12-04 NOTE — Telephone Encounter (Signed)
Please advise RA.

## 2014-12-04 NOTE — Telephone Encounter (Signed)
Patient notified of Dr. Reginia NaasAlva's recommendations. Patient states she has enough medication to last for a while and does not need Rx right now. Per Dr. Vassie LollAlva, ok for patient to take 1 tablet daily  Patient states that she will check into the website recommendation. Nothing further needed. Closing encounter

## 2014-12-08 IMAGING — CT CT CHEST HIGH RESOLUTION W/O CM
2 of 5 series · 15 of 36 positions shown, 18 images · non-contrast
Comparison: CT CHEST W/O CM dated 02/04/2011

CLINICAL DATA: Shortness of breath.  Interstitial lung disease.

EXAM:
CT CHEST WITHOUT CONTRAST
TECHNIQUE: Multidetector CT imaging of the chest was performed following the
standard protocol without IV contrast.

[Series 2: hi res chest 5.0 b40f · axial · 0.56mm/px · z∈[-238,-3]mm · 12 of 53 slices shown, 15 images]
[im 3/53  mediastinal]
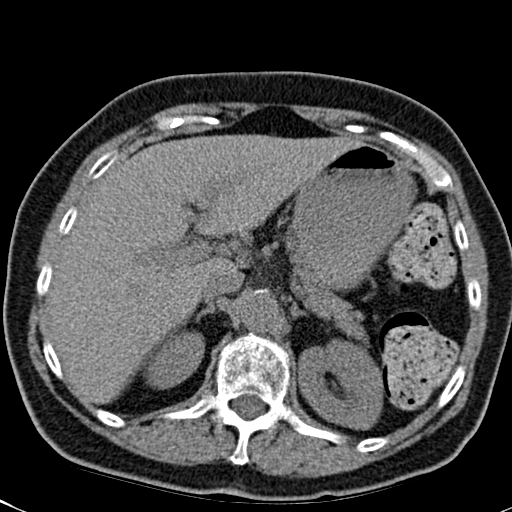
[im 3/53  lung]
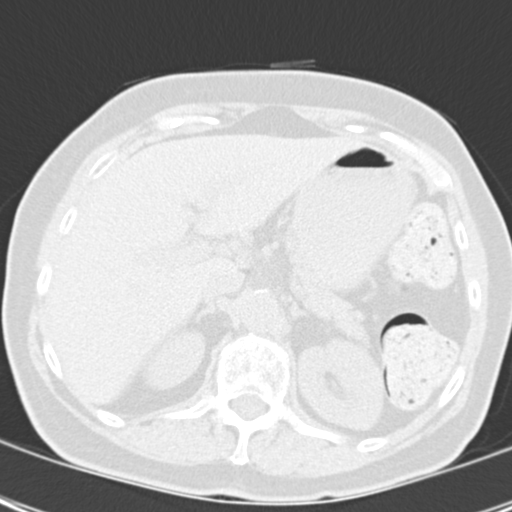
[im 7/53  lung]
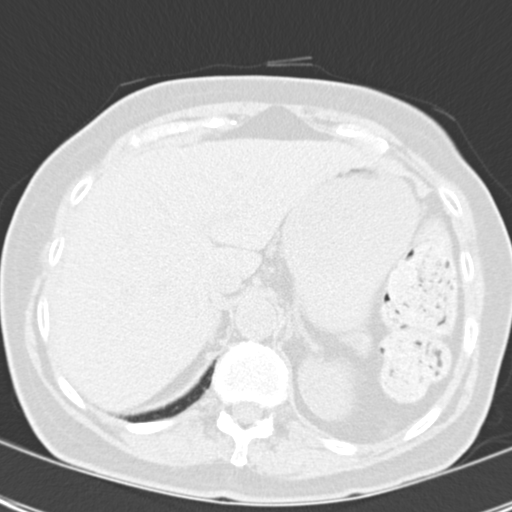
[im 12/53  lung]
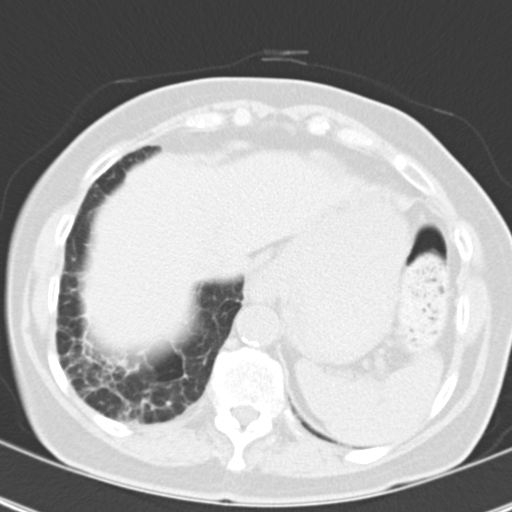
[im 16/53  lung]
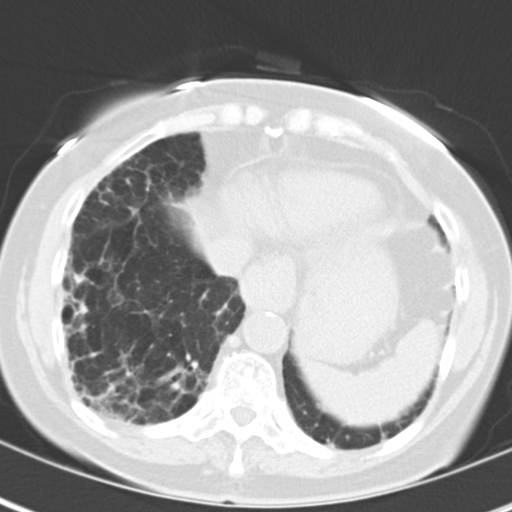
[im 21/53  mediastinal]
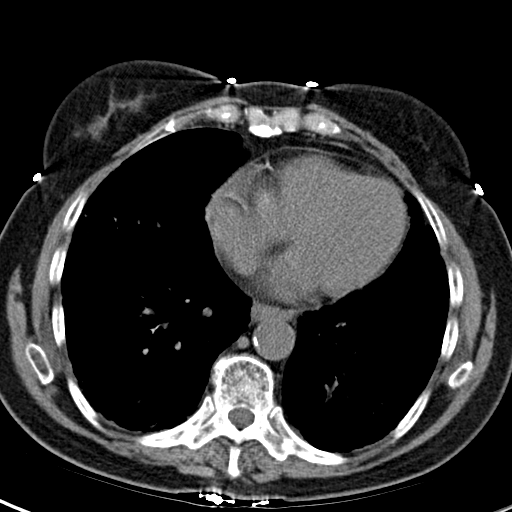
[im 21/53  lung]
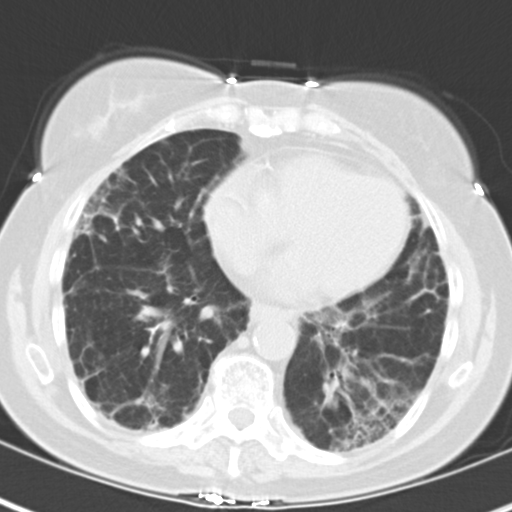
[im 25/53  lung]
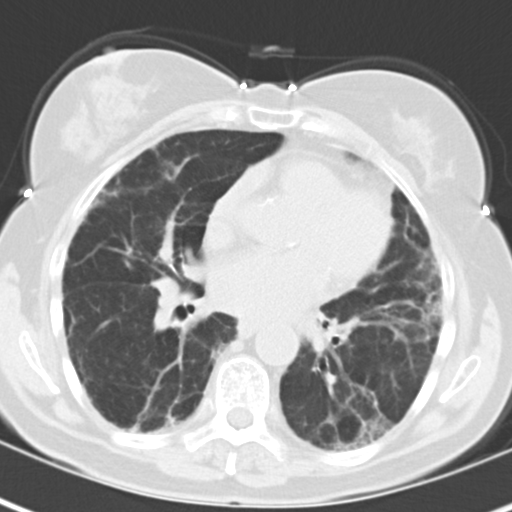
[im 28/53  lung]
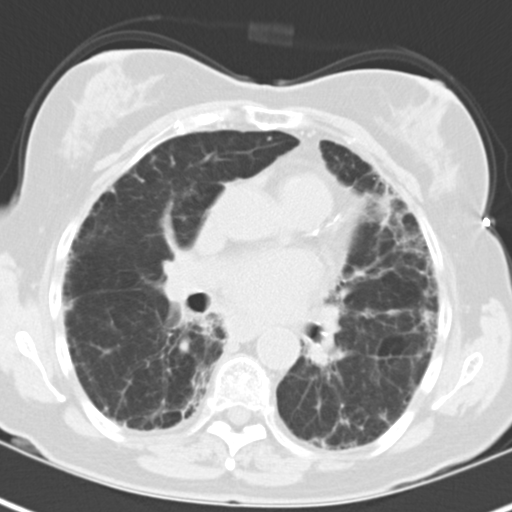
[im 32/53  lung]
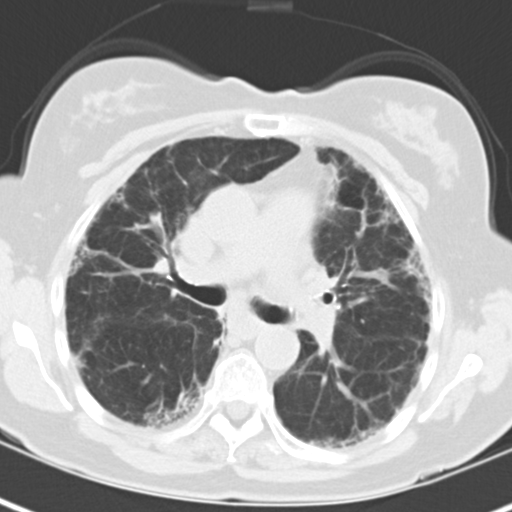
[im 37/53  mediastinal]
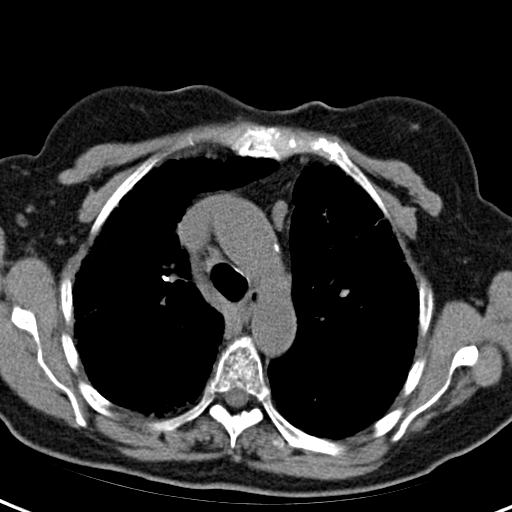
[im 37/53  lung]
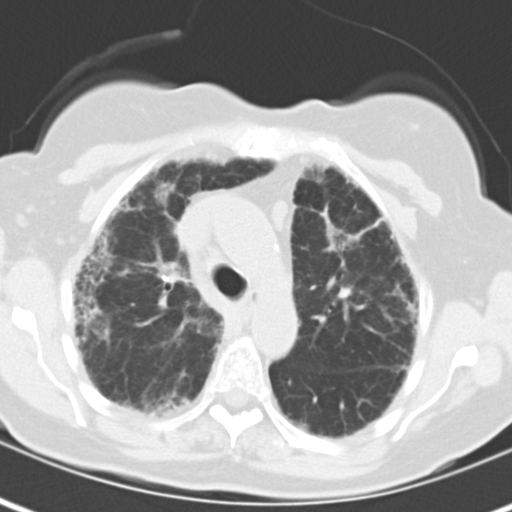
[im 41/53  lung]
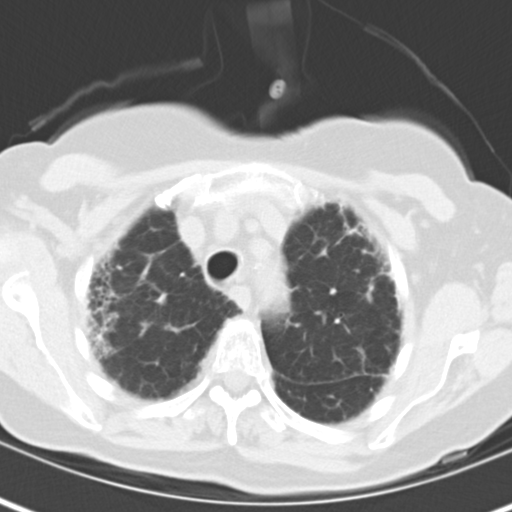
[im 46/53  lung]
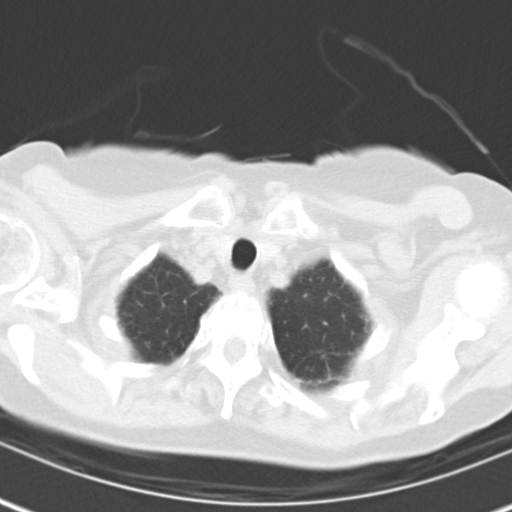
[im 50/53  lung]
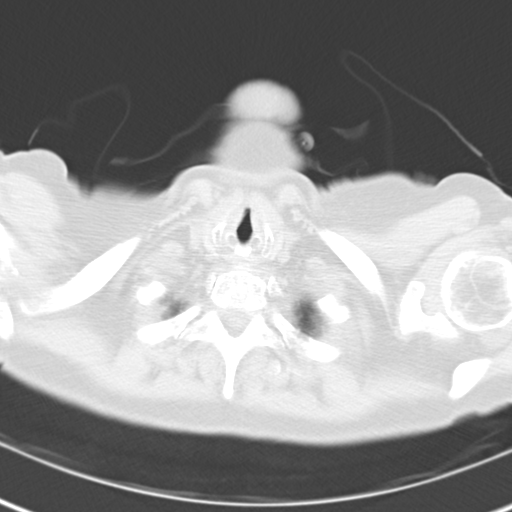

[Series 5: mpr coronal chest · coronal · 0.53mm/px · 3 of 85 slices shown]
[im 17/85  lung]
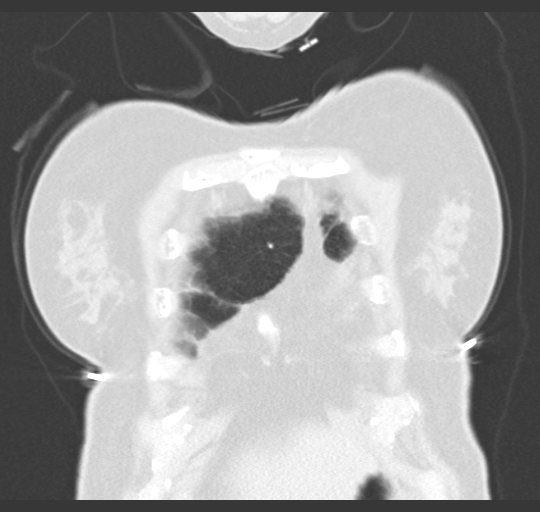
[im 34/85  lung]
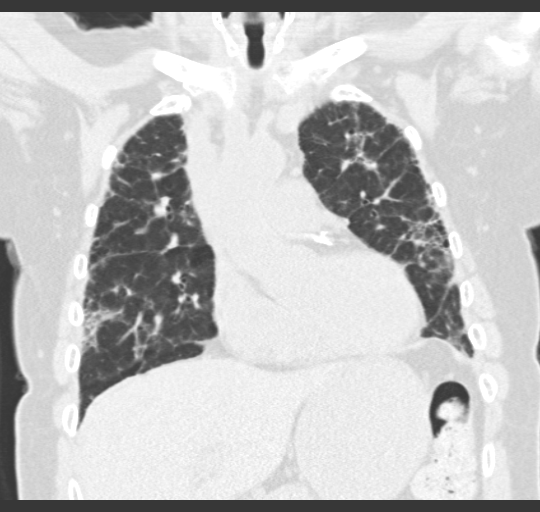
[im 51/85  lung]
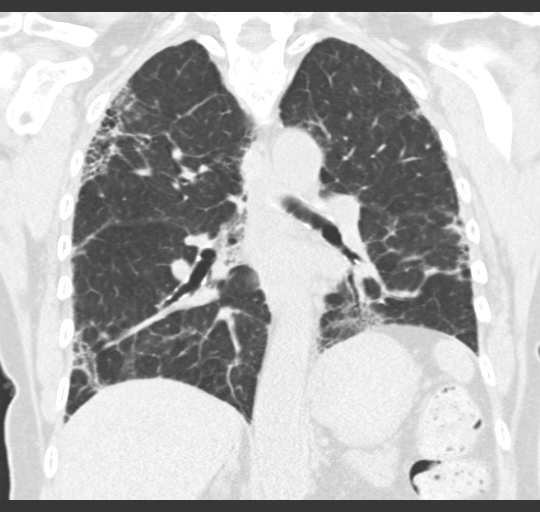

[15 of 36 positions shown; findings below may reference images not displayed]

FINDINGS: There are calcified mediastinal and hilar lymph nodes. No axillary
adenopathy. Three-vessel coronary artery calcification. Pulmonary
arteries appear enlarged, indicative of pulmonary arterial
hypertension. Heart size normal. No pericardial effusion. Small
hiatal hernia.

There is a family peripheral pattern of subpleural reticulation,
traction bronchiectasis/bronchiolectasis and architectural
distortion. Possible honeycombing in the left lower lobe (example
series 3, image 17). Findings are progressive from 02/04/2011
(example left lower lobe, series 3, image 28). Confluent soft tissue
along the lateral margin of the left major fissure (image 25) is
unchanged. Scattered calcified granulomas. No pleural fluid. Airway
is unremarkable.

Incidental imaging of the upper abdomen shows the visualized
portions of the liver, adrenal glands, kidneys, spleen, pancreas,
stomach and bowel to be grossly unremarkable. No pathologically
enlarged lymph nodes. No worrisome lytic or sclerotic lesions.
Degenerative changes are seen in the spine.
IMPRESSION: 1. Peripheral pattern of pulmonary fibrosis is progressive from
02/04/2011 and indicative of usual interstitial pneumonitis (UIP).
2. Three-vessel coronary artery calcification.

## 2014-12-11 ENCOUNTER — Ambulatory Visit (INDEPENDENT_AMBULATORY_CARE_PROVIDER_SITE_OTHER): Payer: Medicare Other | Admitting: Pulmonary Disease

## 2014-12-11 ENCOUNTER — Other Ambulatory Visit (INDEPENDENT_AMBULATORY_CARE_PROVIDER_SITE_OTHER): Payer: Medicare Other

## 2014-12-11 ENCOUNTER — Encounter: Payer: Self-pay | Admitting: Pulmonary Disease

## 2014-12-11 VITALS — BP 100/70 | HR 78 | Ht 62.0 in | Wt 124.4 lb

## 2014-12-11 DIAGNOSIS — Z79899 Other long term (current) drug therapy: Secondary | ICD-10-CM

## 2014-12-11 LAB — HEPATIC FUNCTION PANEL
ALT: 21 U/L (ref 0–35)
AST: 22 U/L (ref 0–37)
Albumin: 3.7 g/dL (ref 3.5–5.2)
Alkaline Phosphatase: 76 U/L (ref 39–117)
BILIRUBIN TOTAL: 0.5 mg/dL (ref 0.2–1.2)
Bilirubin, Direct: 0.1 mg/dL (ref 0.0–0.3)
Total Protein: 6.7 g/dL (ref 6.0–8.3)

## 2014-12-11 MED ORDER — NINTEDANIB ESYLATE 150 MG PO CAPS: 75.0000 mg | ORAL_CAPSULE | Freq: Every day | ORAL | Status: DC

## 2014-12-11 MED ORDER — ONDANSETRON HCL 4 MG PO TABS: 4.0000 mg | ORAL_TABLET | Freq: Three times a day (TID) | ORAL | Status: DC | PRN

## 2014-12-11 NOTE — Patient Instructions (Signed)
LFTs today PFTs on next visit After Christmas, add Ofev 75 mg in a.m. & 150 mg p.m. X 2 weeks , then increase to 150 mg twice a day Okay to take Zofran 4 mg daily as needed for nausea

## 2014-12-11 NOTE — Assessment & Plan Note (Signed)
LFTs today PFTs on next visit After Christmas, add Ofev 75 mg in a.m. & 150 mg p.m. X 2 weeks , then increase to 150 mg twice a day Okay to take Zofran 4 mg daily as needed for nausea 

## 2014-12-11 NOTE — Progress Notes (Signed)
   Subjective:    Patient ID: [name], female    DOB: 07/01/1933, 81 y.o.   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    12/11/2014  Chief Complaint  Patient presents with  . Follow-up    Breathing doing well.  Pt doing well on new dose of OFEV.    Started OFEV 09/2014 - she had increasing nausea hence dose was dropped to 150 once daily in last week of November. She is tolerating this dose well  Does have some mild nausea , that resolves with eating.  No diarrhea.  No flare of cough or DOE.  Previously on Esbriet but stopped due to increased LFT.    Denies chest pain, orthopnea, edema or hemoptysis.   Significant tests/ events   InitialCT chest 02/04/2011 showed patchy areas of interstitial capacities with subpleural predominance and bibasal peripheral bronchiectasis. There were no groundglass opacities noted. Bilateral lymph nodes were calcified and splenic granulomas were noted .  05/2012 CT chest >Peripheral pattern of pulmonary fibrosis is progressive  ANA pos, ACE level 18, ESR 31, RA < 10, hypersens panel neg   03/29/2011 PFTs- no airway obstruction, preserved lung volumes, DLCO 59% corrects for alveolar volume  PFTS 10/2012 show mild drop in FVC from 2.25 to 1.88 & TLC from 3.6 to 2.95  DLCO dropped to 11.1 also % decreased from 59 to 51  6MW -450 m   CT sinuses on 07/2010 showed mild mucosal thickening. Her symptoms of reflux are well controlled on omeprazole.   11/2013 PFTs - preserved FVC & TLC but DLCO dropping further to 43%- 9.5 from 11.1 earlier 06/2014 PFTs  -stable, FVC 67%, TLC 60%, DLCO 44%, stable from 11/2013  Review of Systems neg for any significant  sore throat, dysphagia, itching, sneezing, nasal congestion or excess/ purulent secretions, fever, chills, sweats, unintended wt loss, pleuritic or exertional cp, hempoptysis, orthopnea pnd or change in chronic leg swelling. Also denies presyncope, palpitations, heartburn, abdominal pain, nausea, vomiting, diarrhea or change in bowel or urinary habits, dysuria,hematuria, rash, arthralgias, visual complaints, headache, numbness weakness or ataxia.     Objective:   Physical Exam  Gen. Pleasant, well-nourished, in no distress ENT - no lesions, no post nasal drip Neck: No JVD, no thyromegaly, no carotid bruits Lungs: no use of accessory muscles, no dullness to percussion, bibasal rales, no rhonchi  Cardiovascular: Rhythm regular, heart sounds  normal, no murmurs or gallops, no peripheral edema Musculoskeletal: No deformities, no cyanosis or clubbing        Assessment & Plan:

## 2014-12-26 ENCOUNTER — Telehealth: Payer: Self-pay | Admitting: Pulmonary Disease

## 2014-12-26 NOTE — Telephone Encounter (Signed)
lmtcb x1 for pt. 

## 2014-12-27 NOTE — Telephone Encounter (Signed)
If she can't tolerate even the 75 mg dose then stop it thru the holidays and regroup with alva p 01/05/15

## 2014-12-27 NOTE — Telephone Encounter (Signed)
Patient calling, she said that she wants to stop the OFEV.  She has been having a lot of cramping, nausea, vomiting, diarrhea and constipation.  She says that she feels like her body just cannot handle this medication.  She said all she has been able to eat is potatoes, rice, etc.. Bland foods.  She says she cannot go on like this. She said that she has given it a fair trial, but she just cannot handle this drug.  She is aware that Dr. Vassie LollAlva is not in the office and requests that I ask DOD if she can just stop this medication or does she need to wing down.  Patient is currently only taking 1 pill a day.  (Per Dr. Reginia NaasAlva's last OV:  After Christmas, add Ofev 75 mg in a.m. & 150 mg p.m. X 2 weeks , then increase to 150 mg twice a day)   Dr. Sherene SiresWert, please advise.

## 2014-12-27 NOTE — Telephone Encounter (Signed)
Spoke with pt. Aware of recs below. Will regroup with RA when he returns to the office. Please advise thanks

## 2015-01-06 NOTE — Telephone Encounter (Signed)
Okay to stop medication until follow-up office visit Place patient on call list- I with open additional office times

## 2015-01-07 NOTE — Telephone Encounter (Signed)
Patient notified of Dr. Reginia NaasAlva's recommendations. Placed on call list, pt aware that we will contact her to to schedule OV when RA opens clinic. Nothing further needed.

## 2015-01-13 ENCOUNTER — Ambulatory Visit (INDEPENDENT_AMBULATORY_CARE_PROVIDER_SITE_OTHER): Payer: Medicare Other | Admitting: Internal Medicine

## 2015-01-13 ENCOUNTER — Encounter (INDEPENDENT_AMBULATORY_CARE_PROVIDER_SITE_OTHER): Payer: Self-pay | Admitting: Internal Medicine

## 2015-01-13 VITALS — BP 102/62 | HR 76 | Temp 98.0°F | Ht 62.0 in | Wt 124.9 lb

## 2015-01-13 DIAGNOSIS — Z800 Family history of malignant neoplasm of digestive organs: Secondary | ICD-10-CM | POA: Diagnosis not present

## 2015-01-13 DIAGNOSIS — K5909 Other constipation: Secondary | ICD-10-CM

## 2015-01-13 MED ORDER — POLYETHYLENE GLYCOL 3350 17 GM/SCOOP PO POWD: 1.0000 | Freq: Every day | ORAL | Status: AC

## 2015-01-13 NOTE — Progress Notes (Signed)
Subjective:    Patient ID: [name], female    DOB: [birth year], 82 y.o.   MRN: [id number]  HPI Here today for f/u after colonoscopy in July. She tells me she was started on OFEV for her pulmonary fibrosis but developed diarrhea and nausea and vomiting. She was on the medication x 3 months and stopped. Symptoms resolved after stopping. She tells me she is doing good now. Appetite is good. No weight loss. Se usually has a BM every 3-4 days. She takes a stool softener x 2 a day which helps.  No melena or BRRB.      07/10/2014 Colonoscopy  Indications: Patient is 82 year old Caucasian female was undergoing screening colonoscopy. Last exam was in May 2011 was unremarkable. Family history significant for CRC and sister who was 59-60 at the time of diagnosis and is doing fine at 85. Her mother had adenomas and her brother also has adenomas.  Impression:  Examination performed to cecum. Internal and external hemorrhoids otherwise normal colonoscopy.  Review of Systems Past Medical History  Diagnosis Date  . Supraventricular tachycardia (HCC)   . GERD (gastroesophageal reflux disease)   . Hypertension   . DJD (degenerative joint disease), cervical   . Pneumonia, organism unspecified   . Rhinitis   . Osteopenia   . Lung disease     INTERSTITIAL LUNG DISEASE  . Degenerative disc disease, cervical   . Hyperlipidemia   . Degenerative disc disease, cervical   . Cholelithiasis     Past Surgical History  Procedure Laterality Date  . Nasal sinus surgery  July 2012  . Heart ablation  2008  . Tonsillectomy    . Knee surgery      right KNEE ARTHROSCOPY  . Ankle surgery    . Dilation and curettage of uterus    . Tonsillectomy and adenoidectomy    . Breast surgery      BIOPSIES-BENIGN  . Cholecystectomy N/A 03/21/2012    Procedure: LAPAROSCOPIC CHOLECYSTECTOMY WITH INTRAOPERATIVE CHOLANGIOGRAM;  Surgeon: Velora Heckler, MD;  Location: [facility];  Service: General;  Laterality: N/A;    . Esophagogastroduodenoscopy N/A 11/22/2013    Procedure: ESOPHAGOGASTRODUODENOSCOPY (EGD);  Surgeon: Malissa Hippo, MD;  Location: [facility];  Service: Endoscopy;  Laterality: N/A;  250 - moved to 1:25 - Ann to notify pt  . Colonoscopy N/A 07/10/2014    Procedure: COLONOSCOPY;  Surgeon: Malissa Hippo, MD;  Location: [facility];  Service: Endoscopy;  Laterality: N/A;  1030    Allergies  Allergen Reactions  . Acetaminophen     Can't take due to increased liver enzymes   . Hydrocodone-Homatropine     Upset stomach  . Nsaids     Upset stomach  . Amoxicillin-Pot Clavulanate Rash  . Penicillins Rash  . Procardia [Nifedipine]     HA    Current Outpatient Prescriptions on File Prior to Visit  Medication Sig Dispense Refill  . aspirin 81 MG tablet Take 81 mg by mouth daily.    Marland Kitchen atenolol (TENORMIN) 50 MG tablet Take 0.5 tablet in morning and 1 in the pm    . carboxymethylcellulose (REFRESH TEARS) 0.5 % SOLN Place 1 drop into both eyes 3 (three) times daily as needed.    . cholecalciferol (VITAMIN D) 1000 UNITS tablet Take 1,000 Units by mouth 2 (two) times daily.     . cyclobenzaprine (FLEXERIL) 5 MG tablet Take 1 tablet by mouth daily as needed for muscle spasms.     Marland Kitchen  dexlansoprazole (DEXILANT) 60 MG capsule Take 1 capsule (60 mg total) by mouth daily. 90 capsule 3  . docusate sodium (COLACE) 100 MG capsule Take 100 mg by mouth 2 (two) times daily.    Marland Kitchen. GLUCOSAMINE PO Take 3,000 mg by mouth daily.    Marland Kitchen. guanFACINE (TENEX) 2 MG tablet Take 2 mg by mouth at bedtime.    Marland Kitchen. ipratropium (ATROVENT) 0.03 % nasal spray Place 1 spray into both nostrils daily.     . Multiple Vitamin (MULTIVITAMIN WITH MINERALS) TABS Take 1 tablet by mouth daily.    . Nintedanib (OFEV) 150 MG CAPS Take 75 mg by mouth daily. Take with 150mg  x  2 weeks after christmas, then increase to 150mg  twice a day 60 capsule 0  . ondansetron (ZOFRAN) 4 MG tablet Take 1 tablet (4 mg total) by mouth every 8 (eight) hours  as needed for nausea or vomiting. 20 tablet 0   No current facility-administered medications on file prior to visit.        Objective:   Physical Exam Blood pressure 102/62, pulse 76, temperature 98 F (36.7 C), height 5\' 2"  (1.575 m), weight 124 lb 14.4 oz (56.654 kg). Alert and oriented. Skin warm and dry. Oral mucosa is moist.   . Sclera anicteric, conjunctivae is pink. Thyroid not enlarged. No cervical lymphadenopathy. Lungs clear. Heart regular rate and rhythm.  Abdomen is soft. Bowel sounds are positive. No hepatomegaly. No abdominal masses felt. No tenderness.  No edema to lower extremities.          Assessment & Plan:  Screening colonoscopy. Normal. Next colonoscopy 5 yrs from now. (Family hx of colon cancer) Constipation: Will try her on Miralax 1 scoop a day.

## 2015-01-13 NOTE — Patient Instructions (Signed)
Miralax 1 scoop daily

## 2015-01-27 ENCOUNTER — Ambulatory Visit (INDEPENDENT_AMBULATORY_CARE_PROVIDER_SITE_OTHER): Payer: Medicare Other | Admitting: Pulmonary Disease

## 2015-01-27 ENCOUNTER — Encounter: Payer: Self-pay | Admitting: Pulmonary Disease

## 2015-01-27 VITALS — BP 112/72 | HR 72 | Ht 62.0 in | Wt 126.6 lb

## 2015-01-27 DIAGNOSIS — K219 Gastro-esophageal reflux disease without esophagitis: Secondary | ICD-10-CM

## 2015-01-27 DIAGNOSIS — J849 Interstitial pulmonary disease, unspecified: Secondary | ICD-10-CM | POA: Diagnosis not present

## 2015-01-27 NOTE — Progress Notes (Signed)
Subjective:    Patient ID: [name], female    DOB: 08/05/1933, 81 y.o.   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 Follow-up    Pt c/o wet cough early in the morning and dry during day. Pt denies wheeze/SOB/CP/tightness. Pt would like to discuss other treatment options besides OFEV. Pt reports GI side effects from the medication.    02/2013  started on Esbriet  -Due to elevated LFTs .(AST -90), perfenidone was stopped She was also on statin rx .  Started OFEV 09/2014 - she had increasing nausea - even on lower dose, finally stopped in 12/2014  No flare of cough or DOE.  Previously on Esbriet but stopped due to increased LFT.  Feels much better, breathing is at baseline Has dry cough,   I have reviewed all relevant imaging, labs & test data & reconciled meds   Denies chest pain, orthopnea, edema or hemoptysis.   Significant tests/ events   InitialCT chest 02/04/2011 showed patchy areas of interstitial capacities with subpleural predominance and bibasal peripheral bronchiectasis. There were no groundglass opacities noted. Bilateral lymph nodes were calcified and splenic granulomas were noted .  05/2012 CT chest >Peripheral pattern of pulmonary fibrosis is progressive  ANA pos, ACE level 18, ESR 31, RA < 10, hypersens panel neg   03/29/2011 PFTs- no airway obstruction, preserved lung volumes, DLCO 59% corrects for alveolar volume  PFTS 10/2012 show mild drop in FVC from 2.25 to 1.88 & TLC from 3.6 to 2.95  DLCO dropped to 11.1 also % decreased from 59 to 51  6MW -450 m   CT sinuses on 07/2010 showed mild mucosal thickening. Her symptoms of reflux are well controlled on omeprazole.   11/2013 PFTs - preserved FVC & TLC but DLCO dropping  further to 43%- 9.5 from 11.1 earlier 06/2014 PFTs  -stable, FVC 67%, TLC 60%, DLCO 44%, stable from 11/2013   Review of Systems neg for any significant sore throat, dysphagia, itching, sneezing, nasal congestion or excess/ purulent secretions, fever, chills, sweats, unintended wt loss, pleuritic or exertional cp, hempoptysis, orthopnea pnd or change in chronic leg swelling.    Also denies presyncope, palpitations, heartburn, abdominal pain, or change inurinary habits, dysuria,hematuria, rash, arthralgias, visual complaints, headache, numbness weakness or ataxia.     Objective:   Physical Exam  Gen. Pleasant, well-nourished, in no distress ENT - no lesions, no post nasal drip Neck: No JVD, no thyromegaly, no carotid bruits Lungs: no use of accessory muscles, no dullness to percussion, bibasal 1/2 rales , no rhonchi  Cardiovascular: Rhythm regular, heart sounds  normal, no murmurs or gallops, no peripheral edema Musculoskeletal: No deformities, no cyanosis or clubbing        Assessment & Plan:   

## 2015-01-27 NOTE — Assessment & Plan Note (Addendum)
Stay off medications did not tolerate ofev or esbriet PFTs on next visit in 6 months We discussed natural history  Greater than 50% time was spent in counseling and coordination of care with the patient

## 2015-01-27 NOTE — Patient Instructions (Signed)
Stay off medications PFTs on next visit in 6 months

## 2015-01-27 NOTE — Assessment & Plan Note (Signed)
controlled 

## 2015-02-20 ENCOUNTER — Encounter (HOSPITAL_COMMUNITY): Payer: Self-pay | Admitting: Emergency Medicine

## 2015-02-20 ENCOUNTER — Emergency Department (HOSPITAL_COMMUNITY)
Admission: EM | Admit: 2015-02-20 | Discharge: 2015-02-20 | Disposition: A | Discharge status: Home / Self Care | Payer: Medicare Other | Attending: Emergency Medicine | Admitting: Emergency Medicine

## 2015-02-20 ENCOUNTER — Emergency Department (HOSPITAL_COMMUNITY): Payer: Medicare Other

## 2015-02-20 DIAGNOSIS — Z8701 Personal history of pneumonia (recurrent): Secondary | ICD-10-CM | POA: Diagnosis not present

## 2015-02-20 DIAGNOSIS — E785 Hyperlipidemia, unspecified: Secondary | ICD-10-CM | POA: Insufficient documentation

## 2015-02-20 DIAGNOSIS — Z79899 Other long term (current) drug therapy: Secondary | ICD-10-CM | POA: Diagnosis not present

## 2015-02-20 DIAGNOSIS — I10 Essential (primary) hypertension: Secondary | ICD-10-CM | POA: Insufficient documentation

## 2015-02-20 DIAGNOSIS — R630 Anorexia: Secondary | ICD-10-CM | POA: Diagnosis not present

## 2015-02-20 DIAGNOSIS — Z7982 Long term (current) use of aspirin: Secondary | ICD-10-CM | POA: Diagnosis not present

## 2015-02-20 DIAGNOSIS — Z87891 Personal history of nicotine dependence: Secondary | ICD-10-CM | POA: Diagnosis not present

## 2015-02-20 DIAGNOSIS — I471 Supraventricular tachycardia: Secondary | ICD-10-CM | POA: Diagnosis not present

## 2015-02-20 DIAGNOSIS — K219 Gastro-esophageal reflux disease without esophagitis: Secondary | ICD-10-CM | POA: Diagnosis not present

## 2015-02-20 DIAGNOSIS — Z880 Allergy status to penicillin: Secondary | ICD-10-CM | POA: Insufficient documentation

## 2015-02-20 DIAGNOSIS — Z8739 Personal history of other diseases of the musculoskeletal system and connective tissue: Secondary | ICD-10-CM | POA: Diagnosis not present

## 2015-02-20 DIAGNOSIS — J40 Bronchitis, not specified as acute or chronic: Secondary | ICD-10-CM | POA: Insufficient documentation

## 2015-02-20 DIAGNOSIS — R0602 Shortness of breath: Secondary | ICD-10-CM | POA: Diagnosis present

## 2015-02-20 MED ORDER — ALBUTEROL SULFATE HFA 108 (90 BASE) MCG/ACT IN AERS
2.0000 | INHALATION_SPRAY | Freq: Four times a day (QID) | RESPIRATORY_TRACT | Status: DC
  Filled 2015-02-20: qty 6.7

## 2015-02-20 MED ORDER — DM-GUAIFENESIN ER 30-600 MG PO TB12: 1.0000 | ORAL_TABLET | Freq: Two times a day (BID) | ORAL | Status: AC

## 2015-02-20 NOTE — ED Notes (Signed)
Having runny nose for two days and cough started last night.  C/o SOB today, easy to breathe in and hard to breathe out.  C/o rib pain bilaterally from cough.

## 2015-02-20 NOTE — ED Provider Notes (Signed)
CSN: [id number]     Arrival date & time 02/20/15  1353 History   First MD Initiated Contact with Patient 02/20/15 1855     Chief Complaint  Patient presents with  . Shortness of Breath     (Consider location/radiation/quality/duration/timing/severity/associated sxs/prior Treatment) Patient is a 82 y.o. female presenting with shortness of breath. The history is provided by the patient.  Shortness of Breath Associated symptoms: cough and wheezing   Associated symptoms: no abdominal pain, no chest pain, no fever, no headaches, no rash, no sore throat and no vomiting    patient with complaint of runny nose for 2 days last night started with a cough today cough started to be productive. No fevers but associated with decreased appetite no nausea vomiting or diarrhea. Patient with shortness of breath today and feeling as if maybe she's wheezing. Patient with a history of pulmonary fibrosis. Patient not on any oxygen at home. Patient's primary care doctors in [place]. Patient is followed by pulmonary medicine in [place].  Past Medical History  Diagnosis Date  . Supraventricular tachycardia (HCC)   . GERD (gastroesophageal reflux disease)   . Hypertension   . DJD (degenerative joint disease), cervical   . Pneumonia, organism unspecified   . Rhinitis   . Osteopenia   . Lung disease     INTERSTITIAL LUNG DISEASE  . Degenerative disc disease, cervical   . Hyperlipidemia   . Degenerative disc disease, cervical   . Cholelithiasis   . Pulmonary fibrosis [facility])    Past Surgical History  Procedure Laterality Date  . Nasal sinus surgery  July 2012  . Heart ablation  2008  . Tonsillectomy    . Knee surgery      right KNEE ARTHROSCOPY  . Ankle surgery    . Dilation and curettage of uterus    . Tonsillectomy and adenoidectomy    . Breast surgery      BIOPSIES-BENIGN  . Cholecystectomy N/A 03/21/2012    Procedure: LAPAROSCOPIC CHOLECYSTECTOMY WITH INTRAOPERATIVE CHOLANGIOGRAM;  Surgeon:  Velora Heckler, MD;  Location: [facility];  Service: General;  Laterality: N/A;  . Esophagogastroduodenoscopy N/A 11/22/2013    Procedure: ESOPHAGOGASTRODUODENOSCOPY (EGD);  Surgeon: Malissa Hippo, MD;  Location[facility];  Service: Endoscopy;  Laterality: N/A;  250 - moved to 1:25 - Ann to notify pt  . Colonoscopy N/A 07/10/2014    Procedure: COLONOSCOPY;  Surgeon: Malissa Hippo, MD;  Location: [facility];  Service: Endoscopy;  Laterality: N/A;  1030   Family History  Problem Relation Age of Onset  . Diabetes Father   . Stroke Father   . Leukemia Mother   . Lupus Sister   . Heart disease Sister   . Arthritis Sister   . Heart attack Sister   . Colon cancer Sister   . Seizures Sister   . Lung disease Brother   . Colon polyps Brother   . Gout Brother   . Hypertension Brother    Social History  Substance Use Topics  . Smoking status: Former Smoker -- 0.80 packs/day for 25 years    Quit date: 01/05/1980  . Smokeless tobacco: Never Used  . Alcohol Use: No   OB History    No data available     Review of Systems  Constitutional: Positive for appetite change. Negative for fever.  HENT: Positive for congestion and sinus pressure. Negative for sore throat.   Eyes: Negative for visual disturbance.  Respiratory: Positive for cough, shortness of breath and  wheezing.   Cardiovascular: Negative for chest pain.  Gastrointestinal: Negative for nausea, vomiting, abdominal pain and diarrhea.  Genitourinary: Negative for dysuria.  Musculoskeletal: Negative for back pain.  Skin: Negative for rash.  Neurological: Negative for headaches.  Hematological: Does not bruise/bleed easily.  Psychiatric/Behavioral: Negative for confusion.      Allergies  Acetaminophen; Hydrocodone-homatropine; Nsaids; Amoxicillin-pot clavulanate; Penicillins; and Procardia  Home Medications   Prior to Admission medications   Medication Sig Start Date End Date Taking? Authorizing Provider  aspirin EC 81  MG tablet Take 81 mg by mouth daily.   Yes Historical Provider, MD  atenolol (TENORMIN) 25 MG tablet Take 25-50 tablets by mouth 2 (two) times daily. 1 tablet in the morning and 2 tablets at night 12/26/14  Yes Historical Provider, MD  carboxymethylcellulose (REFRESH TEARS) 0.5 % SOLN Place 1 drop into both eyes 3 (three) times daily as needed (dry eyes).    Yes Historical Provider, MD  cholecalciferol (VITAMIN D) 1000 UNITS tablet Take 1,000 Units by mouth daily.    Yes Historical Provider, MD  GLUCOSAMINE PO Take 3,000 mg by mouth daily.   Yes Historical Provider, MD  guanFACINE (TENEX) 2 MG tablet Take 2 mg by mouth at bedtime.   Yes Historical Provider, MD  ipratropium (ATROVENT) 0.03 % nasal spray Place 1 spray into both nostrils daily.  07/08/13  Yes Historical Provider, MD  lovastatin (MEVACOR) 10 MG tablet Take 1 tablet by mouth at bedtime. 01/22/15  Yes Historical Provider, MD  Multiple Vitamin (MULTIVITAMIN WITH MINERALS) TABS Take 1 tablet by mouth daily.   Yes Historical Provider, MD  omeprazole (PRILOSEC) 40 MG capsule Take 40 mg by mouth daily.   Yes Historical Provider, MD  polyethylene glycol powder (GLYCOLAX/MIRALAX) powder Take 255 g by mouth daily. 1 scoop a day 01/13/15  Yes Terri Ailene Ards, NP  cyclobenzaprine (FLEXERIL) 5 MG tablet Take 1 tablet by mouth daily as needed for muscle spasms.     Historical Provider, MD  dextromethorphan-guaiFENesin (MUCINEX DM) 30-600 MG 12hr tablet Take 1 tablet by mouth 2 (two) times daily. 02/20/15   Vanetta Mulders, MD   BP 145/76 mmHg  Pulse 90  Temp(Src) 98.2 F (36.8 C) (Oral)  Resp 16  Ht 5\' 2"  (1.575 m)  Wt 55.339 kg  BMI 22.31 kg/m2  SpO2 98% Physical Exam  Constitutional: She is oriented to person, place, and time. She appears well-developed and well-nourished. No distress.  HENT:  Head: Normocephalic and atraumatic.  Mouth/Throat: Oropharynx is clear and moist.  Eyes: Conjunctivae and EOM are normal. Pupils are equal, round, and  reactive to light.  Neck: Normal range of motion. Neck supple.  Cardiovascular: Normal rate, regular rhythm and normal heart sounds.   No murmur heard. Pulmonary/Chest: Effort normal and breath sounds normal. No respiratory distress. She has no wheezes.  Abdominal: Soft. Bowel sounds are normal. There is no tenderness.  Musculoskeletal: Normal range of motion.  Neurological: She is alert and oriented to person, place, and time. No cranial nerve deficit. She exhibits normal muscle tone. Coordination normal.  Skin: Skin is warm. No rash noted.  Nursing note and vitals reviewed.   ED Course  Procedures (including critical care time) Labs Review Labs Reviewed - No data to display  Imaging Review Dg Chest 2 View  02/20/2015  CLINICAL DATA:  Known pulmonary fibrosis.  Cough. EXAM: CHEST  2 VIEW COMPARISON:  June 24, 2014 FINDINGS: No pneumothorax. Diffuse reticular opacities are unchanged consistent with known pulmonary fibrosis. No  new infiltrate is identified on today's study. IMPRESSION: Pulmonary fibrosis.  No interval change. Electronically Signed   By: Gerome Sam III M.D   On: 02/20/2015 14:22   I have personally reviewed and evaluated these images and lab results as part of my medical decision-making.   EKG Interpretation None      MDM   Final diagnoses:  Bronchitis    Symptoms consistent with bronchitis chest x-rays negative for pneumonia. Patient has a known history of pulmonary fibrosis without any significant change.  We'll treat with Mucinex DM for the productive cough and give her an albuterol inhaler. No wheezing on examination here to day.    Vanetta Mulders, MD 02/20/15 [phone]

## 2015-02-20 NOTE — Discharge Instructions (Signed)
How to Use an Inhaler °Proper inhaler technique is very important. Good technique ensures that the medicine reaches the lungs. Poor technique results in depositing the medicine on the tongue and back of the throat rather than in the airways. If you do not use the inhaler with good technique, the medicine will not help you. °STEPS TO FOLLOW IF USING AN INHALER WITHOUT AN EXTENSION TUBE °1. Remove the cap from the inhaler. °2. If you are using the inhaler for the first time, you will need to prime it. Shake the inhaler for 5 seconds and release four puffs into the air, away from your face. Ask your health care provider or pharmacist if you have questions about priming your inhaler. °3. Shake the inhaler for 5 seconds before each breath in (inhalation). °4. Position the inhaler so that the top of the canister faces up. °5. Put your index finger on the top of the medicine canister. Your thumb supports the bottom of the inhaler. °6. Open your mouth. °7. Either place the inhaler between your teeth and place your lips tightly around the mouthpiece, or hold the inhaler 1-2 inches away from your open mouth. If you are unsure of which technique to use, ask your health care provider. °8. Breathe out (exhale) normally and as completely as possible. °9. Press the canister down with your index finger to release the medicine. °10. At the same time as the canister is pressed, inhale deeply and slowly until your lungs are completely filled. This should take 4-6 seconds. Keep your tongue down. °11. Hold the medicine in your lungs for 5-10 seconds (10 seconds is best). This helps the medicine get into the small airways of your lungs. °12. Breathe out slowly, through pursed lips. Whistling is an example of pursed lips. °13. Wait at least 15-30 seconds between puffs. Continue with the above steps until you have taken the number of puffs your health care provider has ordered. Do not use the inhaler more than your health care provider  tells you. °14. Replace the cap on the inhaler. °15. Follow the directions from your health care provider or the inhaler insert for cleaning the inhaler. °STEPS TO FOLLOW IF USING AN INHALER WITH AN EXTENSION (SPACER) °1. Remove the cap from the inhaler. °2. If you are using the inhaler for the first time, you will need to prime it. Shake the inhaler for 5 seconds and release four puffs into the air, away from your face. Ask your health care provider or pharmacist if you have questions about priming your inhaler. °3. Shake the inhaler for 5 seconds before each breath in (inhalation). °4. Place the open end of the spacer onto the mouthpiece of the inhaler. °5. Position the inhaler so that the top of the canister faces up and the spacer mouthpiece faces you. °6. Put your index finger on the top of the medicine canister. Your thumb supports the bottom of the inhaler and the spacer. °7. Breathe out (exhale) normally and as completely as possible. °8. Immediately after exhaling, place the spacer between your teeth and into your mouth. Close your lips tightly around the spacer. °9. Press the canister down with your index finger to release the medicine. °10. At the same time as the canister is pressed, inhale deeply and slowly until your lungs are completely filled. This should take 4-6 seconds. Keep your tongue down and out of the way. °11. Hold the medicine in your lungs for 5-10 seconds (10 seconds is best). This helps the   medicine get into the small airways of your lungs. Exhale. 12. Repeat inhaling deeply through the spacer mouthpiece. Again hold that breath for up to 10 seconds (10 seconds is best). Exhale slowly. If it is difficult to take this second deep breath through the spacer, breathe normally several times through the spacer. Remove the spacer from your mouth. 13. Wait at least 15-30 seconds between puffs. Continue with the above steps until you have taken the number of puffs your health care provider has  ordered. Do not use the inhaler more than your health care provider tells you. 14. Remove the spacer from the inhaler, and place the cap on the inhaler. 15. Follow the directions from your health care provider or the inhaler insert for cleaning the inhaler and spacer. If you are using different kinds of inhalers, use your quick relief medicine to open the airways 10-15 minutes before using a steroid if instructed to do so by your health care provider. If you are unsure which inhalers to use and the order of using them, ask your health care provider, nurse, or respiratory therapist. If you are using a steroid inhaler, always rinse your mouth with water after your last puff, then gargle and spit out the water. Do not swallow the water. AVOID:  Inhaling before or after starting the spray of medicine. It takes practice to coordinate your breathing with triggering the spray.  Inhaling through the nose (rather than the mouth) when triggering the spray. HOW TO DETERMINE IF YOUR INHALER IS FULL OR NEARLY EMPTY You cannot know when an inhaler is empty by shaking it. A few inhalers are now being made with dose counters. Ask your health care provider for a prescription that has a dose counter if you feel you need that extra help. If your inhaler does not have a counter, ask your health care provider to help you determine the date you need to refill your inhaler. Write the refill date on a calendar or your inhaler canister. Refill your inhaler 7-10 days before it runs out. Be sure to keep an adequate supply of medicine. This includes making sure it is not expired, and that you have a spare inhaler.  SEEK MEDICAL CARE IF:   Your symptoms are only partially relieved with your inhaler.  You are having trouble using your inhaler.  You have some increase in phlegm. SEEK IMMEDIATE MEDICAL CARE IF:   You feel little or no relief with your inhalers. You are still wheezing and are feeling shortness of breath or  tightness in your chest or both.  You have dizziness, headaches, or a fast heart rate.  You have chills, fever, or night sweats.  You have a noticeable increase in phlegm production, or there is blood in the phlegm. MAKE SURE YOU:   Understand these instructions.  Will watch your condition.  Will get help right away if you are not doing well or get worse.   This information is not intended to replace advice given to you by your health care provider. Make sure you discuss any questions you have with your health care provider.  Use inhaler 2 puffs every 6 hours for the next 7 days. Take Mucinex DM as directed. Return for any new or worse symptoms. Follow up with your doctor as need. Drink plenty of fluids.   Document Released: 12/19/1999 Document Revised: 10/11/2012 Document Reviewed: 07/20/2012 Elsevier Interactive Patient Education [employer]

## 2015-04-14 ENCOUNTER — Ambulatory Visit: Payer: Medicare Other | Admitting: Pulmonary Disease

## 2015-06-12 ENCOUNTER — Ambulatory Visit: Payer: Medicare Other | Admitting: Pulmonary Disease

## 2015-07-28 ENCOUNTER — Ambulatory Visit (INDEPENDENT_AMBULATORY_CARE_PROVIDER_SITE_OTHER): Payer: Medicare Other | Admitting: Pulmonary Disease

## 2015-07-28 ENCOUNTER — Encounter: Payer: Self-pay | Admitting: Pulmonary Disease

## 2015-07-28 DIAGNOSIS — Z79899 Other long term (current) drug therapy: Secondary | ICD-10-CM | POA: Diagnosis not present

## 2015-07-28 DIAGNOSIS — J849 Interstitial pulmonary disease, unspecified: Secondary | ICD-10-CM | POA: Diagnosis not present

## 2015-07-28 LAB — PULMONARY FUNCTION TEST
DL/VA % pred: 66 %
DL/VA: 3.02 ml/min/mmHg/L
DLCO COR % PRED: 42 %
DLCO cor: 9.11 ml/min/mmHg
DLCO unc % pred: 41 %
DLCO unc: 9.02 ml/min/mmHg
FEF 25-75 Post: 2.27 L/sec
FEF 25-75 Pre: 1.42 L/sec
FEF2575-%Change-Post: 59 %
FEF2575-%Pred-Post: 173 %
FEF2575-%Pred-Pre: 108 %
FEV1-%CHANGE-POST: 8 %
FEV1-%Pred-Post: 78 %
FEV1-%Pred-Pre: 71 %
FEV1-POST: 1.39 L
FEV1-Pre: 1.27 L
FEV1FVC-%CHANGE-POST: 4 %
FEV1FVC-%Pred-Pre: 113 %
FEV6-%Change-Post: 4 %
FEV6-%PRED-PRE: 67 %
FEV6-%Pred-Post: 69 %
FEV6-Post: 1.57 L
FEV6-Pre: 1.51 L
FEV6FVC-%Pred-Post: 105 %
FEV6FVC-%Pred-Pre: 105 %
FVC-%CHANGE-POST: 4 %
FVC-%PRED-POST: 66 %
FVC-%PRED-PRE: 63 %
FVC-POST: 1.57 L
FVC-PRE: 1.51 L
PRE FEV1/FVC RATIO: 84 %
Post FEV1/FVC ratio: 88 %
Post FEV6/FVC ratio: 100 %
Pre FEV6/FVC Ratio: 100 %
RV % pred: 60 %
RV: 1.37 L
TLC % PRED: 62 %
TLC: 2.95 L

## 2015-07-28 NOTE — Patient Instructions (Addendum)
Pulmonary fibrosis appears stable Lung function is stable Oxygen level is a little low -you may need oxygen in the future

## 2015-07-28 NOTE — Progress Notes (Signed)
   Subjective:    Patient ID: [name], female    DOB: February 09, 1933, 82 y.o.   MRN: [id number]  HPI  PCP- jaffe   82 year old remote smoker, retired Haematologist,  for FU of persistent cough and IPF Reviewed imaging going back to 2005 ([facility]- for arthroscopy) showed some bibasal & RUL scarring   07/28/2015 Chief Complaint  Patient presents with  . Follow-up    PFT done today.  no concerns.   Her dyspnea is at baseline No pedal edema She is moving to [place]-and will be establishing with a pulmonologist next month She is moving to assisted living, this has long halls and she feels mildly short of breath   02/2013  started on Esbriet  -Due to elevated LFTs .(AST -90), perfenidone was stopped She was also on statin rx .  Started OFEV 09/2014 - she had increasing nausea - even on lower dose, finally stopped in 12/2014   We reviewed PFTs today and compared to 2016-FVC is stable at 66%, TLC is stable at 62%, DLCO slightly lower at 42%  I have reviewed all relevant imaging, labs & test data & reconciled meds   Denies chest pain, orthopnea, edema or hemoptysis.   Significant tests/ events   InitialCT chest 02/04/2011 showed patchy areas of interstitial capacities with subpleural predominance and bibasal peripheral bronchiectasis. There were no groundglass opacities noted. Bilateral lymph nodes were calcified and splenic granulomas were noted .  05/2012 CT chest >Peripheral pattern of pulmonary fibrosis is progressive  ANA pos, ACE level 18, ESR 31, RA < 10, hypersens panel neg   03/29/2011 PFTs- no airway obstruction, preserved lung volumes, DLCO 59% corrects for alveolar volume  PFTS 10/2012 show mild drop in FVC from 2.25 to 1.88 & TLC from 3.6 to 2.95  DLCO dropped to 11.1 also % decreased from 59 to 51  6MW -450 m   CT sinuses on 07/2010 showed mild mucosal thickening. Her symptoms of reflux are well controlled on omeprazole.   11/2013 PFTs - preserved FVC & TLC  but DLCO dropping further to 43%- 9.5 from 11.1 earlier 06/2014 PFTs  -stable, FVC 67%, TLC 60%, DLCO 44%, stable from 11/2013    Review of Systems neg for any significant sore throat, dysphagia, itching, sneezing, nasal congestion or excess/ purulent secretions, fever, chills, sweats, unintended wt loss, pleuritic or exertional cp, hempoptysis, orthopnea pnd or change in chronic leg swelling. Also denies presyncope, palpitations, heartburn, abdominal pain, nausea, vomiting, diarrhea or change in bowel or urinary habits, dysuria,hematuria, rash, arthralgias, visual complaints, headache, numbness weakness or ataxia.     Objective:   Physical Exam  Gen. Pleasant, well-nourished, in no distress ENT - no lesions, no post nasal drip Neck: No JVD, no thyromegaly, no carotid bruits Lungs: no use of accessory muscles, no dullness to percussion, bibasal rales no rhonchi  Cardiovascular: Rhythm regular, heart sounds  normal, no murmurs or gallops, no peripheral edema Musculoskeletal: No deformities, no cyanosis or clubbing         Assessment & Plan:

## 2015-07-28 NOTE — Assessment & Plan Note (Signed)
Pulmonary fibrosis appears stable Lung function is stable Oxygen level is a little low -you may need oxygen in the future  Has not tolerated anti-fibrotic medicines in the past

## 2015-12-02 ENCOUNTER — Encounter (INDEPENDENT_AMBULATORY_CARE_PROVIDER_SITE_OTHER): Payer: Self-pay | Admitting: Internal Medicine

## 2015-12-02 ENCOUNTER — Encounter (INDEPENDENT_AMBULATORY_CARE_PROVIDER_SITE_OTHER): Payer: Self-pay

## 2016-01-13 ENCOUNTER — Ambulatory Visit (INDEPENDENT_AMBULATORY_CARE_PROVIDER_SITE_OTHER): Payer: Medicare Other | Admitting: Internal Medicine

## 2016-11-04 DEATH — deceased

## 2018-02-09 NOTE — Progress Notes (Signed)
°  This encounter was created in error - please disregard.  I am closing per Dr. Reginia Naas request. mbp

## 2019-06-27 ENCOUNTER — Encounter (INDEPENDENT_AMBULATORY_CARE_PROVIDER_SITE_OTHER): Payer: Self-pay | Admitting: *Deleted
